# Patient Record
Sex: Female | Born: 1980
Health system: Southern US, Community
[De-identification: ages and names within clinical notes are randomized; demographics above are authoritative.]

## PROBLEM LIST (undated history)

## (undated) ENCOUNTER — Inpatient Hospital Stay (HOSPITAL_COMMUNITY): Payer: Self-pay

## (undated) DIAGNOSIS — R87619 Unspecified abnormal cytological findings in specimens from cervix uteri: Secondary | ICD-10-CM

## (undated) DIAGNOSIS — Z87440 Personal history of urinary (tract) infections: Secondary | ICD-10-CM

## (undated) DIAGNOSIS — R600 Localized edema: Secondary | ICD-10-CM

## (undated) DIAGNOSIS — IMO0002 Reserved for concepts with insufficient information to code with codable children: Secondary | ICD-10-CM

## (undated) DIAGNOSIS — K649 Unspecified hemorrhoids: Secondary | ICD-10-CM

## (undated) DIAGNOSIS — Z8619 Personal history of other infectious and parasitic diseases: Secondary | ICD-10-CM

## (undated) DIAGNOSIS — E669 Obesity, unspecified: Secondary | ICD-10-CM

## (undated) HISTORY — DX: Reserved for concepts with insufficient information to code with codable children: IMO0002

## (undated) HISTORY — DX: Obesity, unspecified: E66.9

## (undated) HISTORY — DX: Personal history of urinary (tract) infections: Z87.440

## (undated) HISTORY — DX: Personal history of other infectious and parasitic diseases: Z86.19

## (undated) HISTORY — DX: Unspecified hemorrhoids: K64.9

## (undated) HISTORY — DX: Localized edema: R60.0

## (undated) HISTORY — DX: Unspecified abnormal cytological findings in specimens from cervix uteri: R87.619

---
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
## 2002-04-21 ENCOUNTER — Emergency Department (HOSPITAL_COMMUNITY): Admission: EM | Admit: 2002-04-21 | Discharge: 2002-04-21 | Payer: Self-pay | Admitting: Emergency Medicine

## 2002-04-21 ENCOUNTER — Encounter: Payer: Self-pay | Admitting: Emergency Medicine

## 2002-09-05 ENCOUNTER — Emergency Department (HOSPITAL_COMMUNITY): Admission: EM | Admit: 2002-09-05 | Discharge: 2002-09-05 | Payer: Self-pay | Admitting: *Deleted

## 2002-11-21 ENCOUNTER — Emergency Department (HOSPITAL_COMMUNITY): Admission: EM | Admit: 2002-11-21 | Discharge: 2002-11-21 | Payer: Self-pay | Admitting: Emergency Medicine

## 2003-10-12 ENCOUNTER — Inpatient Hospital Stay (HOSPITAL_COMMUNITY): Admission: AD | Admit: 2003-10-12 | Discharge: 2003-10-12 | Payer: Self-pay | Admitting: Obstetrics and Gynecology

## 2003-11-04 ENCOUNTER — Inpatient Hospital Stay (HOSPITAL_COMMUNITY): Admission: AD | Admit: 2003-11-04 | Discharge: 2003-11-04 | Payer: Self-pay | Admitting: Obstetrics and Gynecology

## 2003-11-10 ENCOUNTER — Inpatient Hospital Stay (HOSPITAL_COMMUNITY): Admission: AD | Admit: 2003-11-10 | Discharge: 2003-11-10 | Payer: Self-pay | Admitting: Obstetrics and Gynecology

## 2003-11-13 ENCOUNTER — Inpatient Hospital Stay (HOSPITAL_COMMUNITY): Admission: AD | Admit: 2003-11-13 | Discharge: 2003-11-13 | Payer: Self-pay | Admitting: Obstetrics and Gynecology

## 2003-11-19 ENCOUNTER — Inpatient Hospital Stay (HOSPITAL_COMMUNITY): Admission: AD | Admit: 2003-11-19 | Discharge: 2003-11-19 | Payer: Self-pay | Admitting: Obstetrics and Gynecology

## 2003-12-10 ENCOUNTER — Inpatient Hospital Stay (HOSPITAL_COMMUNITY): Admission: AD | Admit: 2003-12-10 | Discharge: 2003-12-10 | Payer: Self-pay | Admitting: Obstetrics and Gynecology

## 2003-12-13 ENCOUNTER — Inpatient Hospital Stay (HOSPITAL_COMMUNITY): Admission: AD | Admit: 2003-12-13 | Discharge: 2003-12-13 | Payer: Self-pay | Admitting: Obstetrics and Gynecology

## 2003-12-18 ENCOUNTER — Inpatient Hospital Stay (HOSPITAL_COMMUNITY): Admission: AD | Admit: 2003-12-18 | Discharge: 2003-12-18 | Payer: Self-pay | Admitting: Obstetrics and Gynecology

## 2003-12-21 ENCOUNTER — Inpatient Hospital Stay (HOSPITAL_COMMUNITY): Admission: AD | Admit: 2003-12-21 | Discharge: 2003-12-21 | Payer: Self-pay | Admitting: Obstetrics and Gynecology

## 2003-12-23 ENCOUNTER — Inpatient Hospital Stay (HOSPITAL_COMMUNITY): Admission: AD | Admit: 2003-12-23 | Discharge: 2003-12-25 | Payer: Self-pay | Admitting: Obstetrics and Gynecology

## 2005-05-30 IMAGING — US US FETAL BPP W/O NONSTRESS
1 series · 13 of 27 positions shown · non-contrast
Comparison: none

CLINICAL DATA: History of preterm labor on Terbutiline with bleeding.  Assess cervix, placenta and amniotic fluid.

[Series 1: unknown · 0.30mm/px · 13 of 27 slices shown]
[im 2/27]
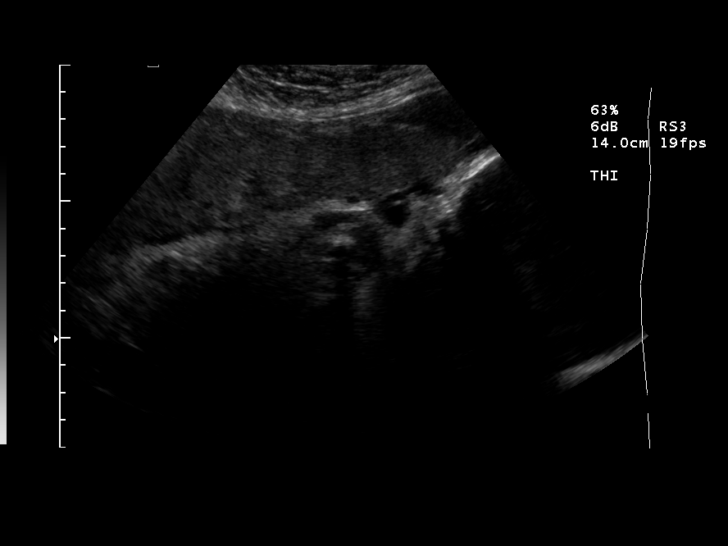
[im 4/27]
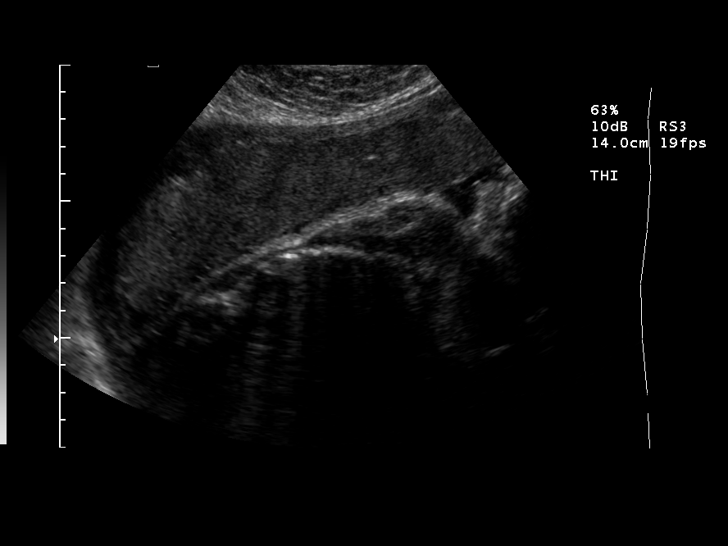
[im 6/27]
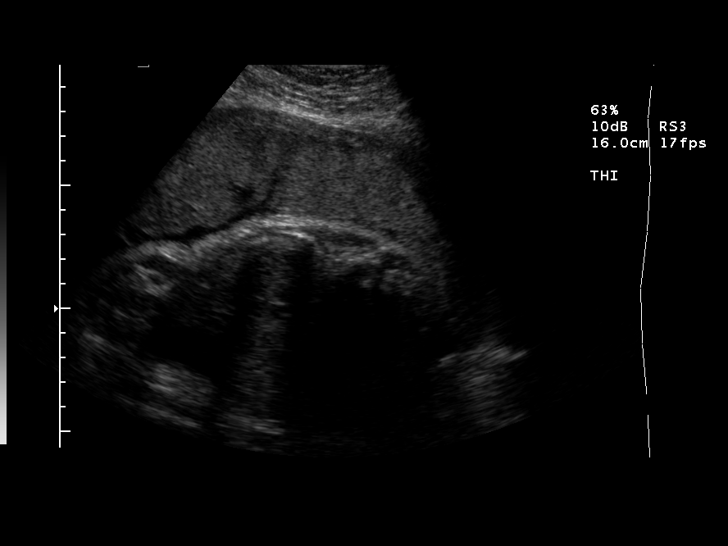
[im 8/27]
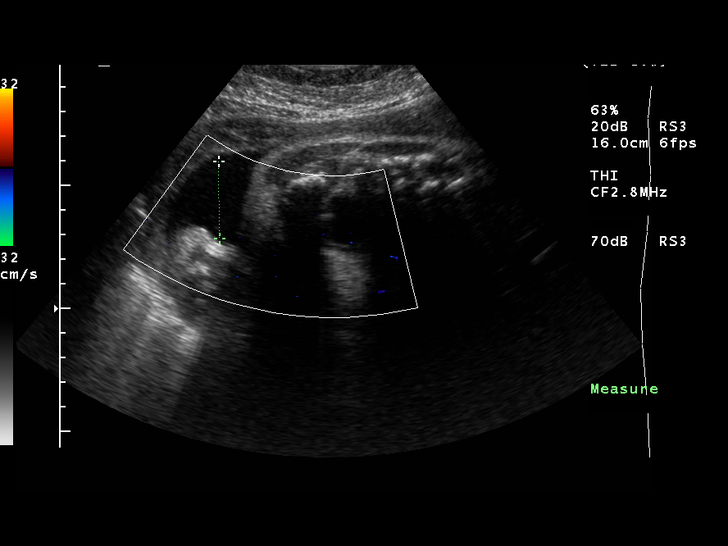
[im 10/27]
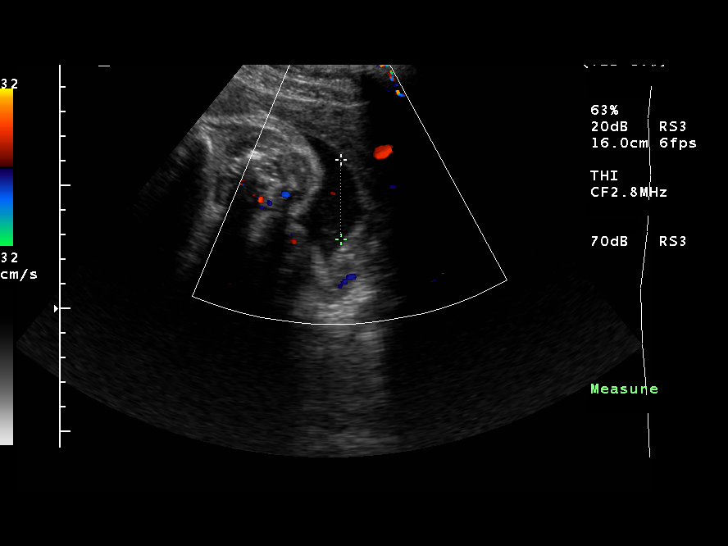
[im 12/27]
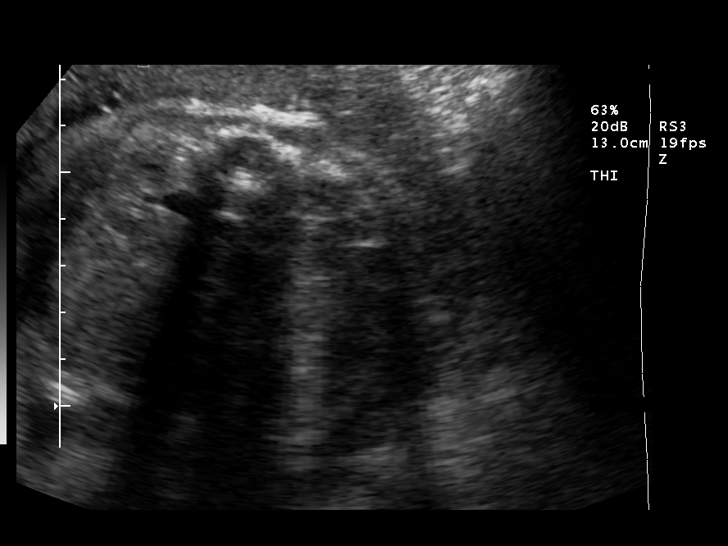
[im 14/27]
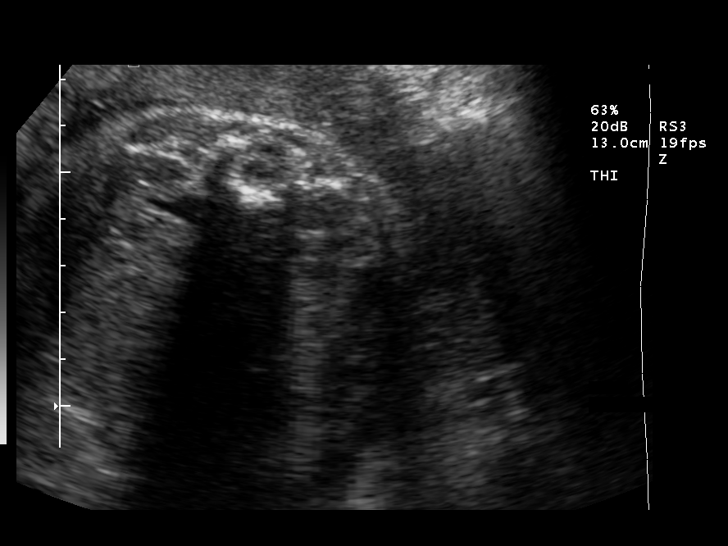
[im 16/27]
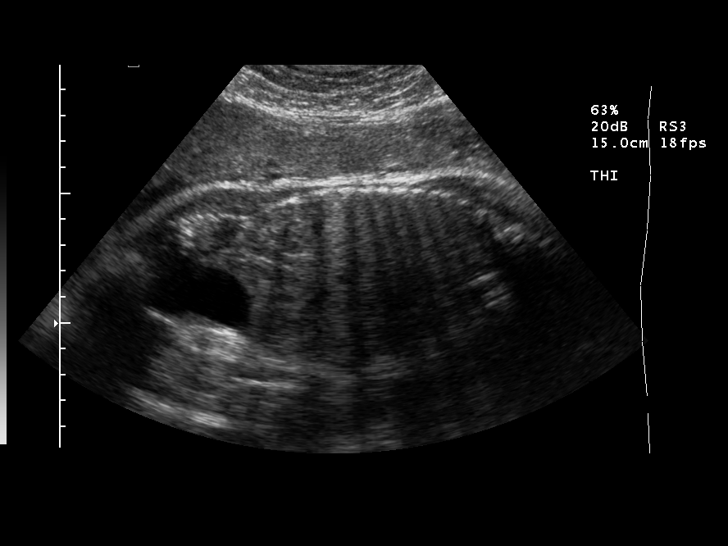
[im 18/27]
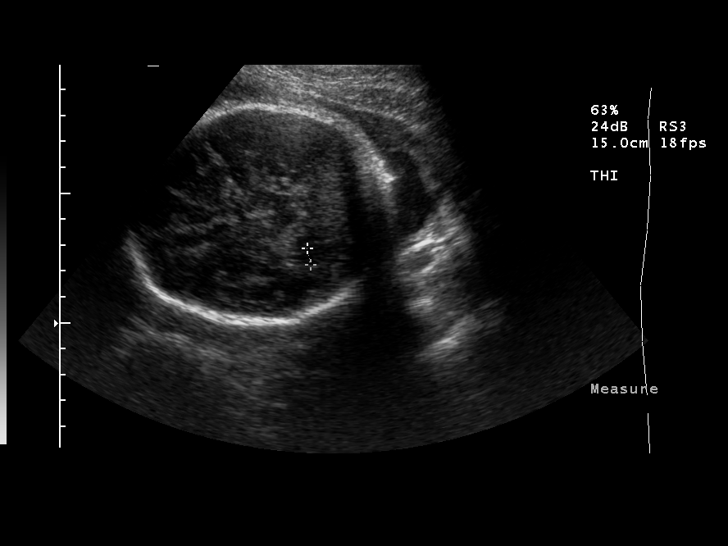
[im 20/27]
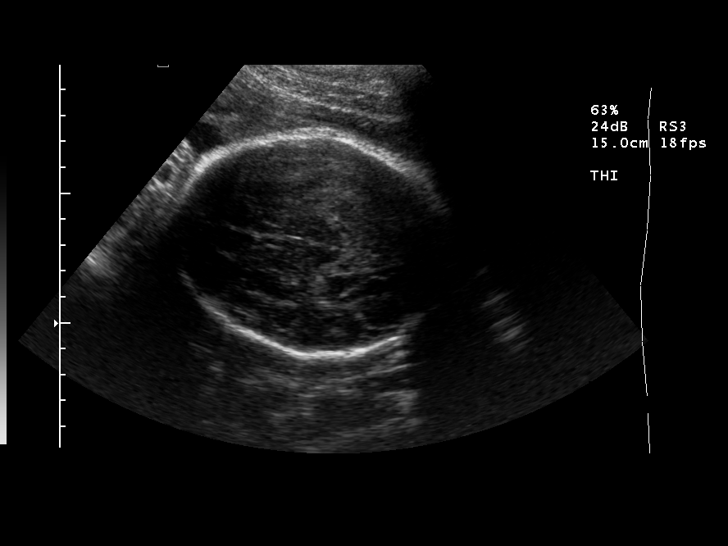
[im 22/27]
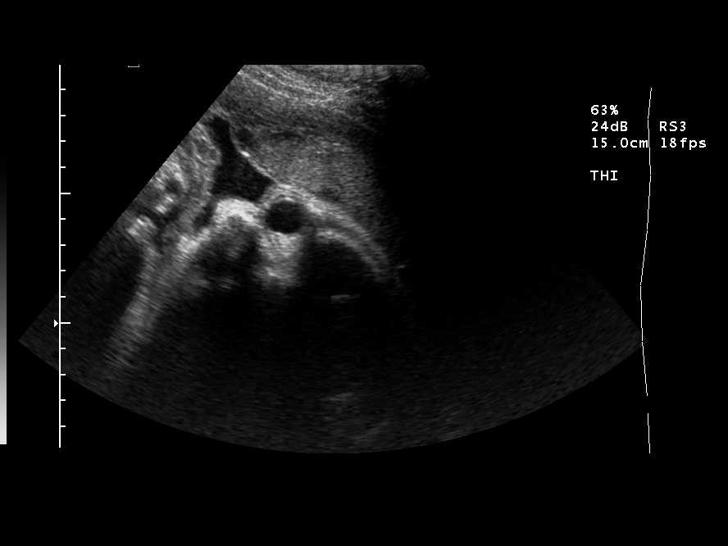
[im 24/27]
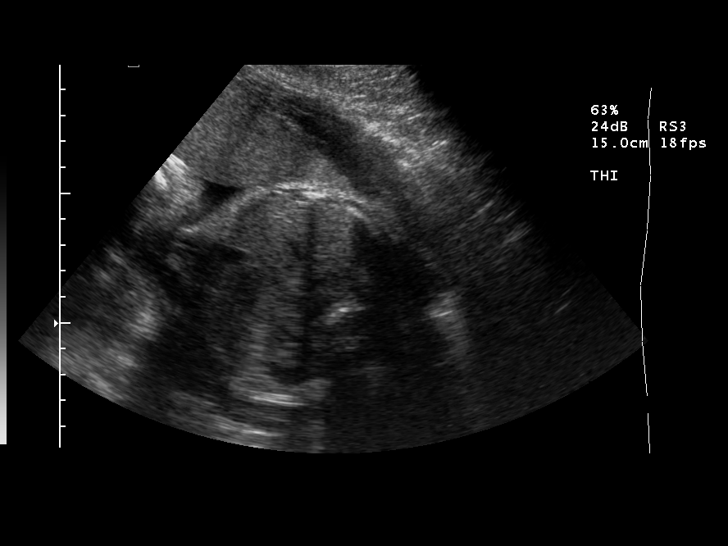
[im 26/27]
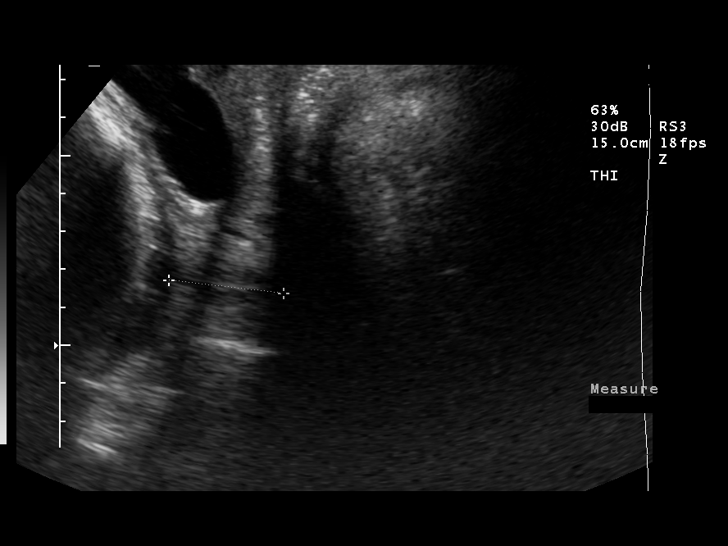

[13 of 27 positions shown; findings below may reference images not displayed]

LIMITED OBSTETRICAL ULTRASOUND
Number of Fetuses:  1
Heart Rate:  133
Movement:  Yes
Breathing:  Yes
Presentation:  Cephalic
Placental Location:  Anterior
Grade:  II
Previa:  No
Comment:  No evidence for retroplacental, preplacental or marginal hemorrhage is noted
Amniotic Fluid (Subjective):  Low normal
Amniotic Fluid (Objective):  8.7 cm AFI (5th -95th%ile =   8.3 ? 23.5 cm for 33 wks)

Fetal measurements and complete anatomic evaluation were not requested.  The following fetal anatomy was visualized during this exam:  Lateral ventricles, thalami, posterior fossa, stomach, cord insertion site, kidneys, bladder, upper lip and orbits.

MATERNAL FINDINGS
Cervix:  3.0 cm Translabially

BIOPHYSICAL PROFILE

Movement:  2    Time:  20 minutes
Breathing:  2
Tone:  2
Amniotic Fluid:  2

Total Score:  8
IMPRESSION: Subjectively and quantitatively low normal amniotic fluid volume.  
Normal cervical length.
Biophysical profile score 8 of 8.
No late developing fetal anatomic abnormalities are identified associated with the kidneys, bladder, stomach or lateral ventricles.  A four chamber heart view could not be reassessed due to positioning.  
No placental abnormality identified.

## 2005-06-23 IMAGING — US US FETAL BPP W/O NONSTRESS
1 series · 13 of 24 positions shown · non-contrast
Comparison: none

CLINICAL DATA: Spotting and contractions.

[Series 1: unknown · 0.30mm/px · 13 of 24 slices shown]
[im 1/24]
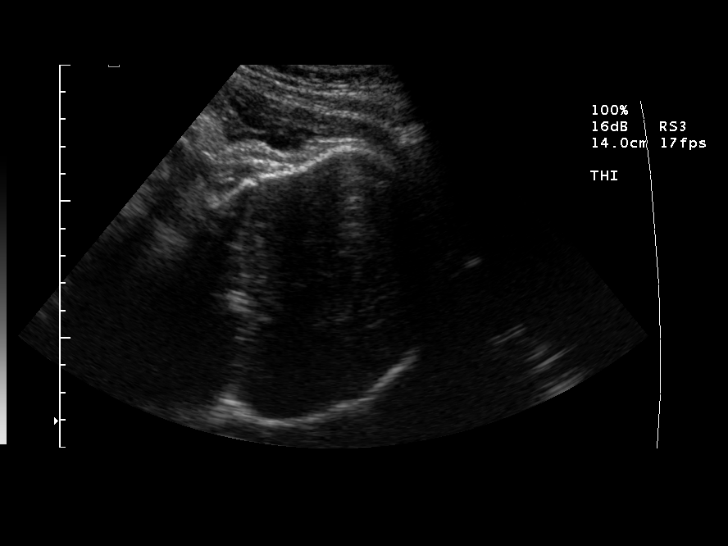
[im 3/24]
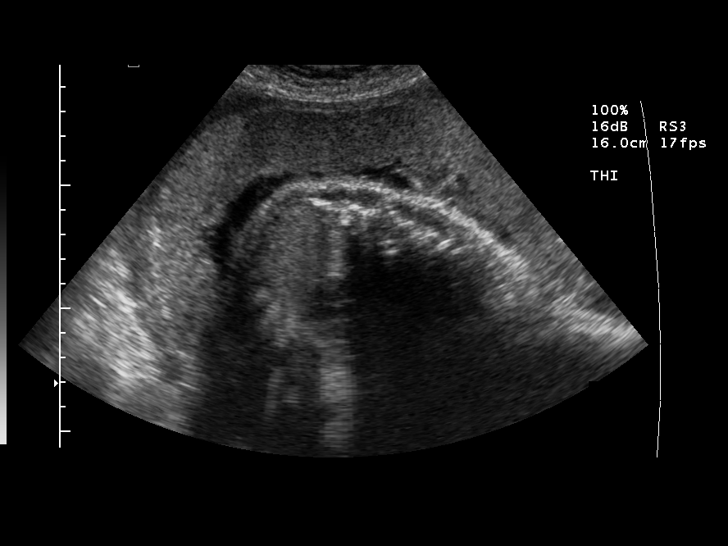
[im 5/24]
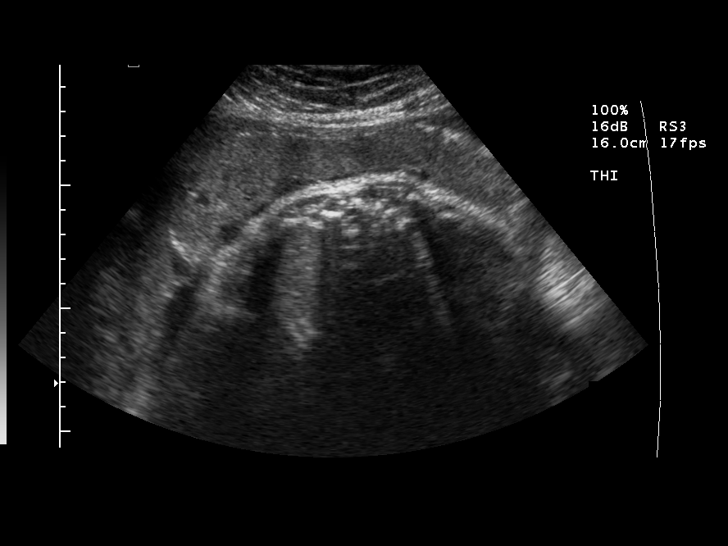
[im 7/24]
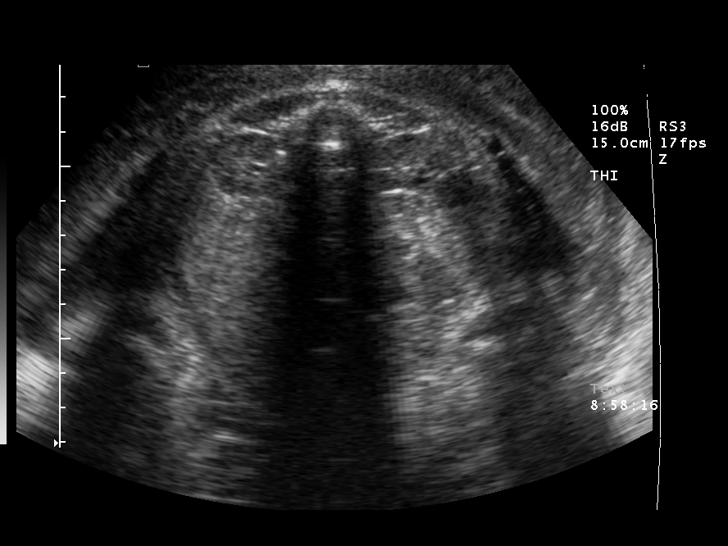
[im 9/24]
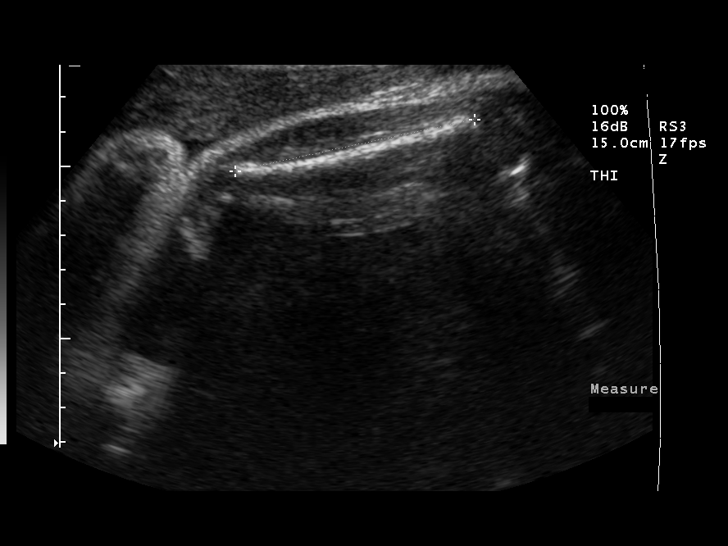
[im 11/24]
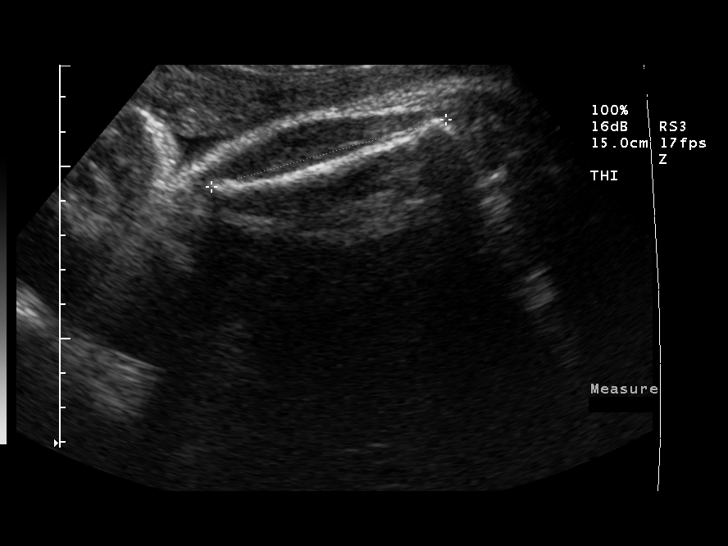
[im 13/24]
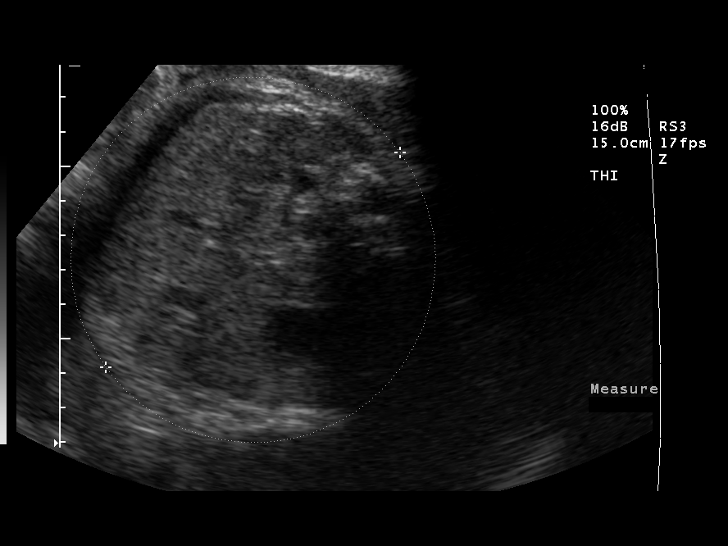
[im 14/24]
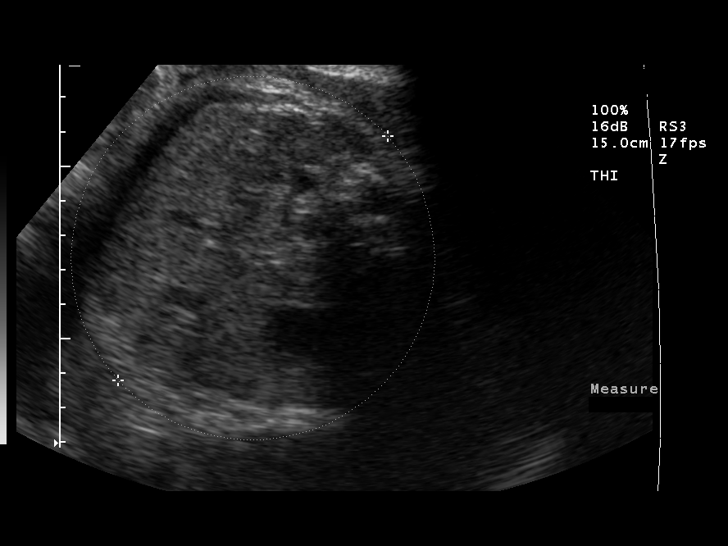
[im 16/24]
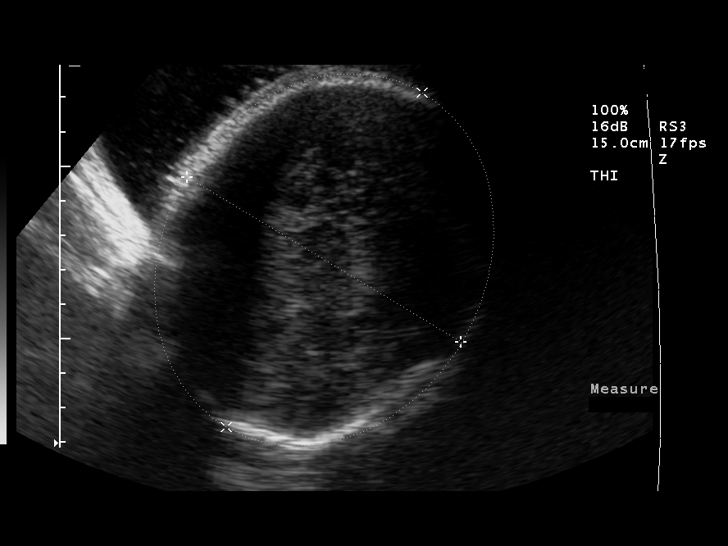
[im 18/24]
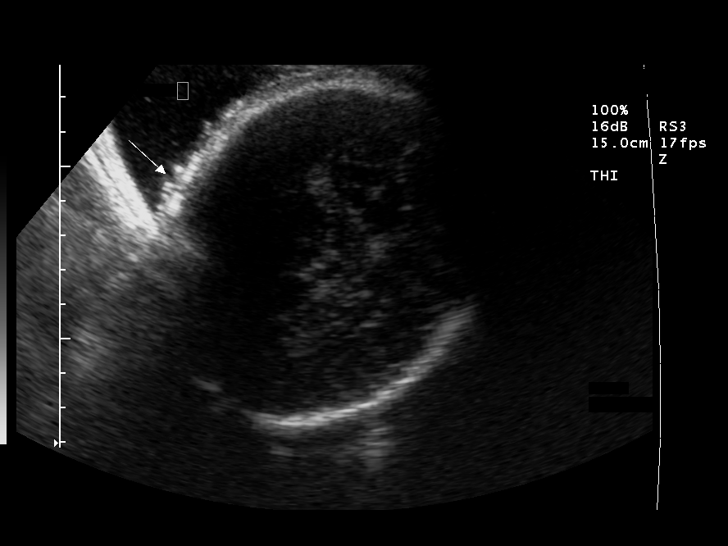
[im 20/24]
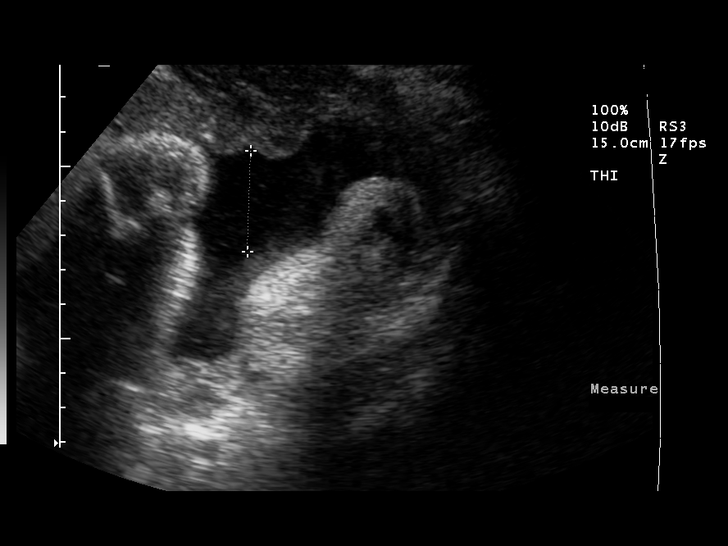
[im 22/24]
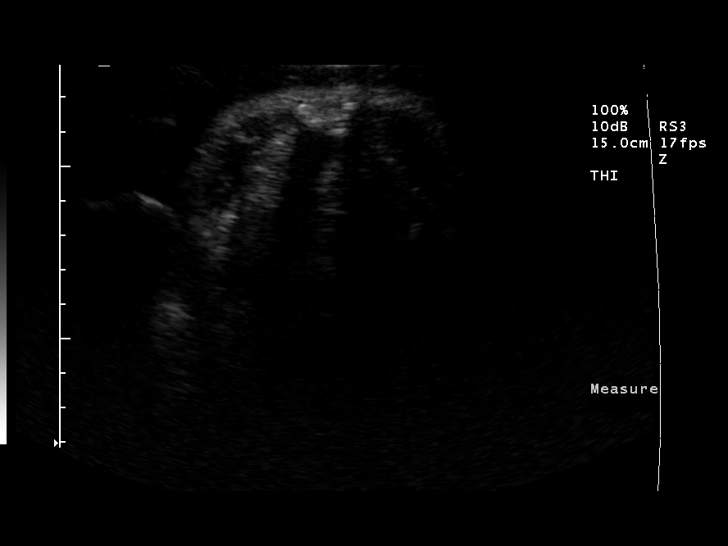
[im 24/24]
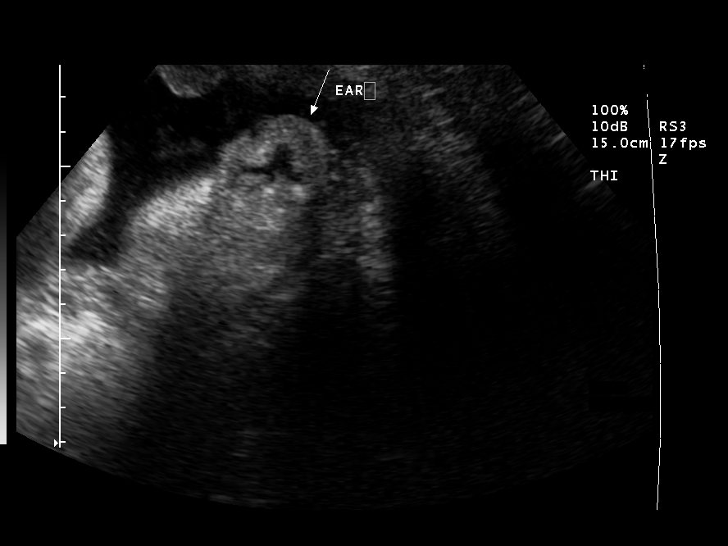

[13 of 24 positions shown; findings below may reference images not displayed]

OBSTETRICAL ULTRASOUND RE-EVALUATION
 Number of Fetuses: 1
 Heart Rate:  135
 Movement:  Yes
 Breathing:  Yes
 Presentation:  Cephalic
 Placental Location:  Anterior
 Grade:  II
 Previa:  No
 Amniotic Fluid (subjective):  Normal
 Amniotic Fluid (objective):  11.2 cm AFI (5th -95th%ile =   7.5 – 24.4 cm for 37 wks)

 FETAL BIOMETRY
 BPD:  8.9 cm   36 w 1 d
 HC:  31.7 cm   35 w 5 d
 AC:  33.1 cm   37 w 0 d
 FL:   7.1 cm   36 w 1 d

 Mean GA:  36 w 2 d
 Assigned GA:  36 w 4 d

 EFW:  5015 g (H) 50th – 75th%ile ( 8119 – 5566 g) For 37 wks

 FETAL ANATOMY
 Lateral Ventricles:  Previously seen 
 Thalami/CSP:  Previously seen 
 Posterior Fossa:  Previously seen 
 Nuchal Region:  N/A
 Spine:  Previously seen 
 4 Chamber Heart on Left:  Previously seen 
 Stomach on Left:  Visualized 
 3 Vessel Cord:  Previously seen 
 Cord Insertion Site:  Previously seen 
 Kidneys:  Visualized 
 Bladder:  Visualized 
 Extremities:  Previously seen 

 Evaluation limited by:  Fetal position and advanced gestational age 

 MATERNAL FINDINGS
 Cervix:  Not evaluated
 BIOPHYSICAL PROFILE

 Movement:   2    Time:  20 minutes
 Breathing:  2
 Tone:  2
 Amniotic Fluid:  2

 Total Score:  8
IMPRESSION: Single living intrauterine fetus in cephalic presentation with subjectively and quantitatively normal amniotic fluid volume.  Interval growth has been appropriate.  
 No focal placental abnormality is identified on today’s study.
 Biophysical profile score is [DATE] after 20 minutes.

## 2005-09-28 ENCOUNTER — Emergency Department (HOSPITAL_COMMUNITY): Admission: EM | Admit: 2005-09-28 | Discharge: 2005-09-28 | Payer: Self-pay | Admitting: Emergency Medicine

## 2009-04-29 ENCOUNTER — Ambulatory Visit: Payer: Self-pay | Admitting: Obstetrics & Gynecology

## 2010-05-10 ENCOUNTER — Ambulatory Visit: Payer: Self-pay | Admitting: Family Medicine

## 2010-05-10 ENCOUNTER — Encounter: Payer: Self-pay | Admitting: Family Medicine

## 2010-05-10 LAB — CONVERTED CEMR LAB
ALT: 15 units/L (ref 0–35)
AST: 14 units/L (ref 0–37)
CO2: 21 meq/L (ref 19–32)
Calcium: 9.5 mg/dL (ref 8.4–10.5)
Creatinine, Ser: 0.88 mg/dL (ref 0.40–1.20)
Glucose, Bld: 80 mg/dL (ref 70–99)
Hemoglobin: 13.4 g/dL (ref 12.0–15.0)
MCV: 90.4 fL (ref 78.0–100.0)
Potassium: 3.9 meq/L (ref 3.5–5.3)
RBC: 4.37 M/uL (ref 3.87–5.11)
Sodium: 139 meq/L (ref 135–145)
Total Bilirubin: 0.4 mg/dL (ref 0.3–1.2)
Total Protein: 7.7 g/dL (ref 6.0–8.3)

## 2010-05-11 ENCOUNTER — Encounter: Payer: Self-pay | Admitting: Family Medicine

## 2010-07-01 ENCOUNTER — Ambulatory Visit: Payer: Self-pay | Admitting: Family Medicine

## 2010-07-01 ENCOUNTER — Encounter: Payer: Self-pay | Admitting: Family Medicine

## 2010-07-01 LAB — CONVERTED CEMR LAB
Beta hcg, urine, semiquantitative: NEGATIVE
Bilirubin Urine: NEGATIVE
GC Probe Amp, Genital: NEGATIVE
Ketones, urine, test strip: NEGATIVE
Nitrite: NEGATIVE
Protein, U semiquant: NEGATIVE
Urobilinogen, UA: 0.2
Whiff Test: NEGATIVE

## 2010-07-03 ENCOUNTER — Encounter: Payer: Self-pay | Admitting: Family Medicine

## 2010-07-11 ENCOUNTER — Ambulatory Visit: Payer: Self-pay | Admitting: Family Medicine

## 2010-07-11 ENCOUNTER — Telehealth: Payer: Self-pay | Admitting: Family Medicine

## 2010-08-15 ENCOUNTER — Telehealth: Payer: Self-pay | Admitting: Family Medicine

## 2010-08-16 ENCOUNTER — Ambulatory Visit: Admit: 2010-08-16 | Payer: Self-pay

## 2010-08-23 NOTE — Letter (Signed)
Summary: Generic Letter  [facility]  [address]   [place], [place] [address]   Phone: [phone]  Fax: [phone]    05/11/2010  Elizebath CUTHBERT [address] [place], [place]  [address]  Dear Ms. Demartin,    It was nice to meet you.  I wanted to let you know that all of your lab work came back normal.  Please continue to take the biotin and see if your hair loss continues.  Please let me know if you think it is getting worse so that we can consider other options.       Sincerely,   Ellery Plunk MD

## 2010-08-23 NOTE — Assessment & Plan Note (Signed)
Summary: np,df   Vital Signs:  Patient profile:   30 year old female Height:      67.5 inches Weight:      205 pounds BMI:     31.75 Temp:     98.4 degrees F oral Pulse rate:   59 / minute BP sitting:   124 / 71  (right arm) Cuff size:   regular  Vitals Entered By: Tessie Fass CMA (May 10, 2010 1:33 PM) CC: NEW PT Is Patient Diabetic? No Pain Assessment Patient in pain? no        CC:  NEW PT.  History of Present Illness: new pt.    mirena- having irregular bleeding which she did not have with her last mirena.  She has also noted hair loss, loss of energy, and weight gain, though the weight has come on gradually since her pregnancy.      Habits & Providers  Alcohol-Tobacco-Diet     Alcohol drinks/day: 0     Alcohol Counseling: not indicated; patient does not drink     Tobacco Status: never     Diet Comments: healthy and varied  Exercise-Depression-Behavior     Does Patient Exercise: yes     Exercise Counseling: to improve exercise regimen     Type of exercise: walking     Exercise (avg: min/session): 30-60     Times/week: 4     STD Risk: never     Drug Use: never     Seat Belt Use: always     Sun Exposure: rarely  Current Medications (verified): 1)  Mirena 20 Mcg/24hr Iud (Levonorgestrel) 2)  Biotin 10 Mg Tabs (Biotin)  Allergies (verified): No Known Drug Allergies  Past History:  Past Medical History: G1P1 NSVD 2005 (Justin)  mirena ( august 2010)  Past Surgical History: none  Family History: sister with type 1 DM Grandmother died of MI at age 67  Social History: works at [employer] husband Lydia Guiles Smoking Status:  never Does Patient Exercise:  yes STD Risk:  never Drug Use:  never Seat Belt Use:  always Sun Exposure-Excessive:  rarely  Review of Systems  The patient denies anorexia, fever, weight loss, chest pain, and syncope.    Physical Exam  General:  Well-developed,well-nourished,in no acute distress;  alert,appropriate and cooperative throughout examination Head:  Normocephalic and atraumatic without obvious abnormalities. hair breakage worse at crown and temples but not balding. Eyes:  vision grossly intact, pupils equal, pupils round, pupils reactive to light, no injection, and no iris abnormalities.   Ears:  R ear normal and L ear normal.   Nose:  no external deformity.   Mouth:  good dentition, no gingival abnormalities, and pharynx pink and moist.   Neck:  supple, full ROM, no masses, no thyromegaly, and no thyroid nodules or tenderness.   Chest Wall:  no deformities and no tenderness.   Lungs:  normal respiratory effort, normal breath sounds, and no crackles.   Heart:  normal rate, regular rhythm, and no murmur.   Abdomen:  Bowel sounds positive,abdomen soft and non-tender without masses, organomegaly or hernias noted. Genitalia:  Pelvic Exam:        External: normal female genitalia without lesions or masses        Vagina: normal without lesions or masses        Cervix: normal without lesions or masses        Adnexa: normal bimanual exam without masses or fullness  Uterus: normal by palpation        Pap smear: performed IUD strings visualized Msk:  No deformity or scoliosis noted of thoracic or lumbar spine.  normal ROM.   Extremities:  No clubbing, cyanosis, edema, or deformity noted with normal full range of motion of all joints.   Neurologic:  No cranial nerve deficits noted. Station and gait are normal. Plantar reflexes are down-going bilaterally. DTRs are symmetrical throughout. Sensory, motor and coordinative functions appear intact. Skin:  Intact without suspicious lesions or rashes Cervical Nodes:  No lymphadenopathy noted Psych:  Cognition and judgment appear intact. Alert and cooperative with normal attention span and concentration. No apparent delusions, illusions, hallucinations   Impression & Recommendations:  Problem # 1:  HEALTH MAINTENANCE EXAM  (ICD-V70.0) Assessment New discussed wellness and weight management.  referred to Dr. Gerilyn Pilgrim for some help with nutrition.  Pt has had an ASCUS pap and has had a colposcopy which was normal.  Last pap 2010 at [facility].  will request records.     Problem # 2:  HAIR LOSS (ICD-704.00) Assessment: New ? alopecia vs brittle hair.  check TSH, CMET, CBC.  no family hx of thyroid dysfunction but sister has DM I.   Orders: TSH-[facility] [id number]) Comp Met-[facility] [id number]) CBC-[facility] (29562)  Complete Medication List: 1)  Mirena 20 Mcg/24hr Iud (Levonorgestrel) 2)  Biotin 10 Mg Tabs (Biotin)  Other Orders: Pap Smear-[facility] ([id number])  Patient Instructions: 1)  It was nice to meet you today. 2)  please make an appt with Dr. Gerilyn Pilgrim our nutritionist on the way out. 3)  I will send you a letter with the pap and the thyroid results.   Orders Added: 1)  Pap Smear-[facility] [[id number]] 2)  TSH-[facility] [[id number]] 3)  Comp Met-[facility] [[id number]] 4)  CBC-[facility] [[id number]] 5)  [facility]- New 18-89yrs [[id number]]

## 2010-08-23 NOTE — Miscellaneous (Signed)
Summary: ROI  ROI   Imported By: De Nurse 05/19/2010 15:34:17  _____________________________________________________________________  External Attachment:    Type:   Image     Comment:   External Document

## 2010-08-25 NOTE — Assessment & Plan Note (Signed)
Summary: iud removal per [facility]   Vital Signs:  Patient profile:   30 year old female Height:      67.5 inches Weight:      202.3 pounds BMI:     31.33 Temp:     97.9 degrees F oral Pulse rate:   60 / minute BP sitting:   134 / 77  (left arm) Cuff size:   regular  Vitals Entered By: Tiffany Grams LPN (July 11, 2010 2:56 PM) CC: IUD removal due to cramping and bleeding Is Patient Diabetic? No Pain Assessment Patient in pain? no        Primary Care Provider:  Ellery Plunk MD  CC:  IUD removal due to cramping and bleeding.  History of Present Illness: Cramping and bleeding x 2 months.  Had previuos IUD in 5 years and had it replaced in 04/2009.  Desires IUD removed.  Has tried pills, and ortho Evra---had skin reaction.   Desires pill.  Discussed that this may not be related to Mirena and u/s is in order, but pt. is not interested in this.  Habits & Providers  Alcohol-Tobacco-Diet     Alcohol drinks/day: 0     Alcohol Counseling: not indicated; patient does not drink     Tobacco Status: never     Diet Comments: healthy and varied  Current Problems (verified): 1)  Abdominal Cramps  (ICD-789.00) 2)  Health Maintenance Exam  (ICD-V70.0) 3)  Hair Loss  (ICD-704.00) 4)  Screening For Malignant Neoplasm of The Cervix  (ICD-V76.2)  Current Medications (verified): 1)  Mirena 20 Mcg/24hr Iud (Levonorgestrel) 2)  Biotin 10 Mg Tabs (Biotin) 3)  Tramadol Hcl 50 Mg Tabs (Tramadol Hcl) .... Take 1-2 Tabs Q6h As Needed Pain. 4)  Promethazine Hcl 12.5 Mg Tabs (Promethazine Hcl) .... Take 1-2 Q6 H As Needed Nausea 5)  Zofran 8 Mg Tabs (Ondansetron Hcl) .... Take One Q 6 Hours As Needed Nausea  Allergies (verified): No Known Drug Allergies  Past History:  Past Medical History: Last updated: 05/10/2010 G1P1 NSVD 2005 [name])  mirena ( august 2010)  Past Surgical History: Last updated: 05/10/2010 none  Family History: Last updated: 05/10/2010 sister with type 1  DM Grandmother died of MI at age 58  Social History: Last updated: 05/10/2010 works at [employer] husband [name] Son [name]  Risk Factors: Alcohol Use: 0 (07/11/2010) Diet: healthy and varied (07/11/2010) Exercise: yes (05/10/2010)  Risk Factors: Smoking Status: never (07/11/2010)  Review of Systems       + nausea, vaginal bleeding  Physical Exam  General:  alert, well-developed, and well-nourished.   Head:  normocephalic and atraumatic.   Neck:  supple.   Lungs:  normal respiratory effort.   Heart:  normal rate.   Abdomen:  soft and non-tender.     Impression & Recommendations:  Problem # 1:  ABDOMINAL CRAMPS (ICD-789.00)  s/p IUD removal It symtoms persist, order pelvic sonogram  Orders: IUD removal -[facility] ([id number])  Problem # 2:  SURVEILLANCE PREV PRESCRIBED CONTRACEPT PILL (ICD-V25.41) Risks discussed with pt.  Advised need to take daily.  No family h/o blood clots.  Complete Medication List: 1)  Mirena 20 Mcg/24hr Iud (Levonorgestrel) 2)  Biotin 10 Mg Tabs (Biotin) 3)  Tramadol Hcl 50 Mg Tabs (Tramadol hcl) .... Take 1-2 tabs q6h as needed pain. 4)  Promethazine Hcl 12.5 Mg Tabs (Promethazine hcl) .... Take 1-2 q6 h as needed nausea 5)  Zofran 8 Mg Tabs (Ondansetron hcl) .... Take one q 6 hours  as needed nausea 6)  Sprintec 28 0.25-35 Mg-mcg Tabs (Norgestimate-eth estradiol) .Marland Kitchen.. 1 by mouth daily  Patient Instructions: 1)  Please schedule a follow-up appointment as needed .  Prescriptions: SPRINTEC 28 0.25-35 MG-MCG TABS (NORGESTIMATE-ETH ESTRADIOL) 1 by mouth daily  #1 pack x 3   Entered and Authorized by:   Tinnie Gens MD   Signed by:   Tinnie Gens MD on 07/11/2010   Method used:   Electronically to        [facility]* (retail)       [address]       [address] Shipping/mailing       [place], [place]  [address]       Ph: [phone]       Fax: [phone]   RxID:   [id number]    Orders Added: 1)  IUD removal -[facility] [[id number]]

## 2010-08-25 NOTE — Assessment & Plan Note (Signed)
Summary: IUD pain,df   Vital Signs:  Patient profile:   30 year old female Height:      67.5 inches Weight:      202.4 pounds BMI:     31.35 Temp:     98.4 degrees F oral Pulse rate:   49 / minute BP sitting:   128 / 41  (left arm) Cuff size:   regular  Vitals Entered By: Garen Grams LPN (July 01, 2010 9:43 AM) CC: pelvic cramd with some nausea Is Patient Diabetic? No Pain Assessment Patient in pain? yes     Location: lower abd Type: cramping   Primary Care Yaremi Stahlman:  Ellery Plunk MD  CC:  pelvic cramd with some nausea.  History of Present Illness: cramping intermittently x 2 wks.  no relieved by ibuprofen.  had to go home from work.  denies bleeding, does not usually have periods on the mirena.  mirena in for 5 years previously with no problem.  this one for 1 year.  no diarrhea, fevers.  one episode of vomitting yesterday.  today does not feel nauseous. pt denies d/c, itching, odor.  pt is married and monogamous.  Habits & Providers  Alcohol-Tobacco-Diet     Alcohol drinks/day: 0     Alcohol Counseling: not indicated; patient does not drink     Tobacco Status: never     Diet Comments: healthy and varied  Current Medications (verified): 1)  Mirena 20 Mcg/24hr Iud (Levonorgestrel) 2)  Biotin 10 Mg Tabs (Biotin) 3)  Tramadol Hcl 50 Mg Tabs (Tramadol Hcl) .... Take 1-2 Tabs Q6h As Needed Pain. 4)  Promethazine Hcl 12.5 Mg Tabs (Promethazine Hcl) .... Take 1-2 Q6 H As Needed Nausea 5)  Zofran 8 Mg Tabs (Ondansetron Hcl) .... Take One Q 6 Hours As Needed Nausea  Allergies (verified): No Known Drug Allergies  Review of Systems       The patient complains of abdominal pain.  The patient denies anorexia, fever, and weight loss.    Physical Exam  General:  NADwell-developed, well-nourished, and well-hydrated.   Abdomen:  soft, non-tender, normal bowel sounds, no distention, and no guarding.   Genitalia:  Pelvic Exam:        External: normal female genitalia  without lesions or masses        Vagina: normal without lesions or masses        Cervix: normal without lesions or masses        Adnexa: normal bimanual exam without masses or fullness        Uterus: normal by palpation        Pap smear: not performed strings easily visualized   Impression & Recommendations:  Problem # 1:  ABDOMINAL CRAMPS (ICD-789.00) Assessment New reviewed results of u preg, u/a, wet prep with pt before she left.  u preg neg so ectopic ruled out.  can visualize strings so mirena has not migrated.  u/a and description not consistent with kidney stone.  ? if this could be related to an ovarian cyst.  pt given red flags, to rtc for follow up as needed.  consider pulling mirena if pt desires though this is unlikely to be source. Orders: Urinalysis-[facility] (00000) U Preg-[facility] (81025) GC/Chlamydia-[facility] (87591/87491) Wet Prep- [facility] ([address]) [facility]- Est  Level 4 ([address])  Complete Medication List: 1)  Mirena 20 Mcg/24hr Iud (Levonorgestrel) 2)  Biotin 10 Mg Tabs (Biotin) 3)  Tramadol Hcl 50 Mg Tabs (Tramadol hcl) .... Take 1-2 tabs q6h as needed pain.  4)  Promethazine Hcl 12.5 Mg Tabs (Promethazine hcl) .... Take 1-2 q6 h as needed nausea 5)  Zofran 8 Mg Tabs (Ondansetron hcl) .... Take one q 6 hours as needed nausea  Patient Instructions: 1)  Today we checked your urine and some vaginal swabs 2)  Your urine was concentrated- maybe you are a little dehydrated with your vomitting.  Drink plenty of water and try the zofran or phenergan if you get sick again.  Let me know if you have a lot of stomach burning. 3)  For the cramps- 1-2 tramadol and 650 of tylenol q 6.  If in one week, not any better, call me and we will set up the U/S.  Come and see me to check in in 2-3 weeks. Prescriptions: ZOFRAN 8 MG TABS (ONDANSETRON HCL) take one q 6 hours as needed nausea  #12 x 1   Entered and Authorized by:   Ellery Plunk MD   Signed by:   Ellery Plunk MD on 07/01/2010   Method used:    Electronically to        [facility] (retail)       [address]       [place], [place]  [address]       Ph: [phone]       Fax: [phone]   RxID:   [id number] PROMETHAZINE HCL 12.5 MG TABS (PROMETHAZINE HCL) take 1-2 q6 h as needed nausea  #12 x 1   Entered and Authorized by:   Ellery Plunk MD   Signed by:   Ellery Plunk MD on 07/01/2010   Method used:   Electronically to        [facility] (retail)       [address]       [place], [place]  [address]       Ph: [phone]       Fax: [phone]   RxID:   [id number] TRAMADOL HCL 50 MG TABS (TRAMADOL HCL) take 1-2 tabs q6h as needed pain.  #30 x 0   Entered and Authorized by:   Ellery Plunk MD   Signed by:   Ellery Plunk MD on 07/01/2010   Method used:   Electronically to        [facility] (retail)       [address]       [place], [place]  [address]       Ph: [phone]       Fax: [phone]   RxID:   [id number]    Orders Added: 1)  Urinalysis-[facility] [00000] 2)  U Preg-[facility] [81025] 3)  GC/Chlamydia-[facility] [87591/87491] 4)  Wet Prep- [facility] [87210] 5)  [facility]- Est  Level 4 [07371]    Laboratory Results   Urine Tests  Date/Time Received: July 01, 2010 10:30 AM  Date/Time Reported: July 01, 2010 11:15 AM   Routine Urinalysis   Color: yellow Appearance: Clear Glucose: negative   (Normal Range: Negative) Bilirubin: negative   (Normal Range: Negative) Ketone: negative   (Normal Range: Negative) Spec. Gravity: >=1.030   (Normal Range: 1.003-1.035) Blood: trace-intact   (Normal Range: Negative) pH: 6.0   (Normal Range: 5.0-8.0) Protein: negative   (Normal Range: Negative) Urobilinogen: 0.2   (Normal Range: 0-1) Nitrite: negative   (Normal Range: Negative) Leukocyte Esterace: negative   (  Normal Range: Negative)  Urine Microscopic WBC/HPF: 1-5 RBC/HPF: 0-3 Bacteria/HPF: 1+ Mucous/HPF:  3+ Epithelial/HPF: 1-5    Urine HCG: negative Comments: ...............test performed by......Marland KitchenBonnie A. [place], MLS (ASCP)cm  Date/Time Received: July 01, 2010 10:30 AM  Date/Time Reported: July 01, 2010 11:17 AM   Wet [place] Source: vag WBC/hpf: >20 Bacteria/hpf: 3+  Rods Clue cells/hpf: none  Negative whiff Yeast/hpf: few Trichomonas/hpf: none Comments: ...............test performed by......Marland KitchenBonnie A. [place], MLS (ASCP)cm

## 2010-08-25 NOTE — Letter (Signed)
Summary: Generic Letter  [facility]  [address]   [place], [place] [address]   Phone: [phone]  Fax: [phone]    07/03/2010  Jeanifer CUDE [address] [place], [place]  [address]  Dear Ms. Schifano,     I wanted to let you know that the rest of the testing you had done was negative.  Please follow up as necessary.  I hope that you are feeling better.      Sincerely,   Ellery Plunk MD  Appended Document: Generic Letter mailed

## 2010-08-25 NOTE — Progress Notes (Signed)
Summary: Triage  Phone Note Call from Patient Call back at Home Phone [phone]   Reason for Call: Talk to Nurse Summary of Call: pt had iud removed about 1 month ago, has not had a normal period since, has a few questions about side effects.  Initial call taken by: Knox Royalty,  August 15, 2010 3:33 PM  Follow-up for Phone Call        IUD  taken out on december 19th & had a period.  not using birth control. is having sex. no periods since. took home test & it is negative. appt tomorrow to see Dr. Lelon Perla & get pregnancy test here. states "we are open to the idea of another" Follow-up by: Golden Circle RN,  August 15, 2010 3:35 PM

## 2010-08-25 NOTE — Progress Notes (Signed)
  Phone Note Call from Patient   Caller: Patient Call For: [phone] Summary of Call: Need to have call back in ref to IUD Initial call taken by: Abundio Miu,  July 11, 2010 8:58 AM  Follow-up for Phone Call        bleeding.  still with some cramping, even with tramadol.  wants her IUD out.  told her to try to make appt with me today. Follow-up by: Ellery Plunk MD,  July 11, 2010 9:40 AM  Additional Follow-up for Phone Call Additional follow up Details #1::        Pt seen by Dr. Shawnie Pons for removal Additional Follow-up by: Abundio Miu,  August 03, 2010 4:19 PM

## 2010-11-22 HISTORY — PX: DILATION AND CURETTAGE OF UTERUS: SHX78

## 2010-11-24 ENCOUNTER — Ambulatory Visit (HOSPITAL_COMMUNITY)
Admission: RE | Admit: 2010-11-24 | Discharge: 2010-11-24 | Disposition: A | Discharge status: Home / Self Care | Payer: 59 | Source: Ambulatory Visit | Attending: Obstetrics and Gynecology | Admitting: Obstetrics and Gynecology

## 2010-11-24 LAB — ABO/RH: ABO/RH(D): O POS

## 2010-11-29 ENCOUNTER — Other Ambulatory Visit: Payer: Self-pay | Admitting: Obstetrics and Gynecology

## 2010-11-29 ENCOUNTER — Ambulatory Visit (HOSPITAL_COMMUNITY)
Admission: RE | Admit: 2010-11-29 | Discharge: 2010-11-29 | Disposition: A | Discharge status: Home / Self Care | Payer: 59 | Source: Ambulatory Visit | Attending: Obstetrics and Gynecology | Admitting: Obstetrics and Gynecology

## 2010-11-29 DIAGNOSIS — O021 Missed abortion: Secondary | ICD-10-CM | POA: Insufficient documentation

## 2010-11-29 LAB — CBC
HCT: 37.8 % (ref 36.0–46.0)
Platelets: 297 10*3/uL (ref 150–400)
WBC: 6.9 10*3/uL (ref 4.0–10.5)

## 2010-12-09 NOTE — H&P (Signed)
NAMEJAQUALA, FULLER                          ACCOUNT NO.:  [id number]   MEDICAL RECORD NO.:  [id number]                   PATIENT TYPE:  INP   LOCATION:  9165                                 FACILITY:  [facility]   PHYSICIAN:  Osborn Coho, M.D.                DATE OF BIRTH:  September 04, 1980   DATE OF ADMISSION:  12/23/2003  DATE OF DISCHARGE:                                HISTORY & PHYSICAL   Ms. [name] is a 30 year old gravida 2, para 0, 0, 1, 0 at 38-1/7 weeks who  presented with spontaneous rupture of membranes at approximately 10:30 p.m.  Clear fluid noted with irregular uterine contractions.  She denies any  bleeding and reports positive fetal movement.   PREGNANCY HAS BEEN REMARKABLE FOR:  1. Abnormal last menstrual period.  2. Asthma.  3. Second trimester spotting.   PRENATAL LABS:  Blood type is O positive Rh antibody negative, VDRL  nonreactive, rubella titer positive, hepatitis B surface antigen negative,  sickle cell test was negative.  GC and Chlamydia cultures were negative at  new OB visit and also at 36 weeks.  Pap smear was normal.  Hemoglobin upon entering the practice was 13.5 (it was 11.6 at 28 weeks).  Group B strep culture was negative at 36 weeks.  Estimated date of  confinement of January 06, 2004 was established by last menstrual period and  was in agreement with ultrasound at approximately 8 and 18 weeks.   HISTORY OF PRESENT PREGNANCY:  The patient under care at approximately 12  weeks at [facility].  She had her initial work up at Dr. Elsie Stain  office.  She had some pink spotting just after that visit and was evaluated.  She had an ultrasound at 18 weeks that showed normal growth and development.  Quadruple screen was declined.  The patient had another episode of bleeding  at 18 weeks, again with no significant findings.  She was treated for a UTI  at 18 weeks.  She was treated for BV at 19 weeks and had some right round  ligament pain at that time  which was treated with Ibuprofen and Darvocet.  The patient had vomiting and pain at 21 weeks and was evaluated.  She  reported some type of nodule in her groin area at 25 weeks, this was  evaluated and noted to be a hidradenitis lesion.  She was placed on Keflex.  She also was given an inhaler for her asthma at that same time.  She was  evaluated out of town for rupture of membranes at approximately 27 weeks and  was found to have no evidence of leaking.  She continued to have some  episodes of pink spotting for which she was evaluated in maternity  admissions.  She continued to demonstrate some glycosuria.  She was  evaluated at maternity admissions between 31-6/7 and 32 weeks (she was  evaluated at maternity admissions  several times).  She also had several  episodes of glycosuria and then had a fasting blood sugar and 2-hour  postprandial.  Fasting blood sugar was 87, postprandial was 140.  No follow  up was needed.  She was on p.r.n. Terbutaline until approximately 35 to 36  weeks and has had ongoing issues with episodes of contractions since that  time.  She was seen in maternity admissions over this past weekend for  evaluation of decreased fetal movement and mild elevation of her blood  pressure, normal findings were noted.   OBSTETRICAL HISTORY:  The patient has history of 1 TAB.   PAST MEDICAL HISTORY:  She was a previous condom user.  She also had  Chlamydia 2 years ago and was treated.  She has a history of asthma but no  recent attack.  She has also had 1 urinary tract infection in the past.   FAMILY HISTORY:  Maternal grandmother had a heart attack.  Her uncles have  had strokes.  Her mother has had hypertension.  Her sister has type 2  diabetes.   ALLERGIES:  The patient is sensitive to Ortho-Ever which causes hives.   GENETIC HISTORY:  Unremarkable.   SOCIAL HISTORY:  The patient is single.  The father of the baby is involved  and supportive, his name is [name].  The patient is a Biomedical scientist at [employer].  Her partner is also a Consulting civil engineer at [employer].  She has been  followed by the Certified Nurse Midwife Service at [facility].  She  denies any alcohol, drug or tobacco use during this pregnancy.   PHYSICAL EXAMINATION:  VITAL SIGNS:  Blood pressure is 144/83, other vital  signs are stable.  HEENT:  Within normal limits.  LUNGS:  Breath sounds are clear.  HEART:  Regular rate and rhythm without murmur.  BREASTS:  Soft and nontender.  ABDOMEN:  Fundal height is approximately 38 cm.  Estimated fetal weight is 7  to 8 pounds.  Uterine contractions are every 2 to 6 minutes with a coupling  and tripling pattern.  The patient is noted to be leaking clear fluid, fern  positive.  CERVIX:  Is 2 to 3 cm, 80 to 90%, vertex at a -1 to -2 station. Fetal heart  rate is reassuring now with some accelerations noted and occasional mild  variables are noted.  EXTREMITIES:  Deep tendon reflexes are 2+ without clonus.  There is trace  edema noted.   IMPRESSION:  1. Intrauterine pregnancy at 38-1/7 weeks.  2. Early labor with spontaneous rupture of membranes.  3. Negative group B strep.   PLAN:  1. Admit to birthing suite for consult with Dr. Su Hilt as attending     physician.  2. Routine Certified Nurse Midwife orders.  3. Plan reevaluation in 1 to 2 hours for progress.  If uterine contraction     pattern remains irregular, consider augmentation.  4. Pain medicine p.r.n.     Renaldo Reel Emilee Hero, C.N.M.                   Osborn Coho, M.D.    VLL/MEDQ  D:  12/23/2003  T:  12/23/2003  Job:  [id number]

## 2010-12-15 NOTE — Consult Note (Signed)
  NAMEROSEANNE, [name]              ACCOUNT NO.:  [id number]  MEDICAL RECORD NO.:  [id number]           PATIENT TYPE:  O  LOCATION:  [facility]                          [facility]:  [facility]  PHYSICIAN:  Hal Morales, M.D.DATE OF BIRTH:  1980-09-14  DATE OF CONSULTATION:  11/29/2010 DATE OF DISCHARGE:                                CONSULTATION   PREOPERATIVE DIAGNOSIS:  Missed abortion at 6 weeks.  POSTOPERATIVE DIAGNOSIS:  Missed abortion at 6 weeks.  OPERATION:  Suction dilatation and evacuation.  SURGEON:  Hal Morales, MD  ANESTHESIA:  Monitored anesthesia care and local.  ESTIMATED BLOOD LOSS:  Less than 10 mL.  COMPLICATIONS:  None.  FINDINGS:  The patient had a moderate amount of products of conception at the time of the D and E.  PROCEDURE:  The patient was taken to the operating room after appropriate identification and placed on the operating table.  After the attainment of monitored anesthesia care, the patient was placed in lithotomy position.  The perineum and vagina were prepped with multiple layers of Betadine and a red Robinson catheter was used to empty the bladder.  The perineum was draped as a sterile field.  A Graves speculum was placed in the vagina and a paracervical block achieved with a total of 10 mL of 2% Xylocaine in the 5 and 7 o'clock positions.  A single- tooth tenaculum was used to grasp the cervix.  The cervix was dilated to accommodate a #7 suction curette.  This suction curette was used to suction evacuate all products of conception from all quadrants of the uterus.  A sharp curette was used to ensure that all products of conception have been removed.  Hemostasis was noted to be adequate.  All instruments were then removed from the vagina.  The patient was taken from the operating room to the recovery room in satisfactory condition having tolerated the procedure well with sponge and instrument counts correct.  Specimen to pathology is  products of conception.  INTRAOPERATIVE MEDICATION:  Toradol 30 mg IV 30 mg IM.  DISCHARGE INSTRUCTIONS:  Printed instructions from the [facility] for D any D.  DISCHARGE MEDICATIONS: 1. NuvaRing one ring per vagina q. 21 days as directed. 2. Doxycycline 100 mg p.o. b.i.d. for 7 days. 3. Ibuprofen 600 mg p.o. q.6 h. for 3 days and then p.r.n. pain. 4. Methergine 0.2 mg p.o. q.6 h. for 2 days.  FOLLOWUP INSTRUCTIONS:  The patient will follow up in 2 weeks with Dr. Pennie Rushing.  BLOOD TYPE:  The patient is Rh positive.     Hal Morales, M.D.     VPH/MEDQ  D:  11/29/2010  T:  11/30/2010  Job:  [id number]  Electronically Signed by Dierdre Forth M.D. on 12/15/2010 11:20:27 PM

## 2011-02-23 ENCOUNTER — Other Ambulatory Visit: Payer: Self-pay | Admitting: Obstetrics and Gynecology

## 2011-02-24 ENCOUNTER — Encounter (HOSPITAL_COMMUNITY): Payer: Self-pay | Admitting: *Deleted

## 2011-02-25 ENCOUNTER — Other Ambulatory Visit: Payer: Self-pay | Admitting: Obstetrics and Gynecology

## 2011-02-25 ENCOUNTER — Ambulatory Visit (HOSPITAL_COMMUNITY): Payer: 59 | Admitting: Anesthesiology

## 2011-02-25 ENCOUNTER — Encounter (HOSPITAL_COMMUNITY): Payer: Self-pay | Admitting: Anesthesiology

## 2011-02-25 ENCOUNTER — Ambulatory Visit (HOSPITAL_COMMUNITY)
Admission: RE | Admit: 2011-02-25 | Discharge: 2011-02-25 | Disposition: A | Discharge status: Home / Self Care | Payer: 59 | Source: Ambulatory Visit | Attending: Obstetrics and Gynecology | Admitting: Obstetrics and Gynecology

## 2011-02-25 ENCOUNTER — Encounter (HOSPITAL_COMMUNITY)
Admission: RE | Disposition: A | Discharge status: Home / Self Care | Payer: Self-pay | Source: Ambulatory Visit | Attending: Obstetrics and Gynecology

## 2011-02-25 DIAGNOSIS — O034 Incomplete spontaneous abortion without complication: Secondary | ICD-10-CM | POA: Insufficient documentation

## 2011-02-25 HISTORY — PX: DILATION AND EVACUATION: SHX1459

## 2011-02-25 LAB — CBC
HCT: 36.2 % (ref 36.0–46.0)
Hemoglobin: 12.5 g/dL (ref 12.0–15.0)
RBC: 3.94 MIL/uL (ref 3.87–5.11)
WBC: 4.7 10*3/uL (ref 4.0–10.5)

## 2011-02-25 SURGERY — DILATION AND EVACUATION, UTERUS
Anesthesia: Monitor Anesthesia Care

## 2011-02-25 MED ORDER — FENTANYL CITRATE 0.05 MG/ML IJ SOLN
INTRAMUSCULAR | Status: DC | PRN
  Administered 2011-02-25 (×2): 25 ug via INTRAVENOUS

## 2011-02-25 MED ORDER — PROPOFOL 10 MG/ML IV EMUL
INTRAVENOUS | Status: AC
  Filled 2011-02-25: qty 50

## 2011-02-25 MED ORDER — METOCLOPRAMIDE HCL 10 MG PO TABS: 10.0000 mg | ORAL_TABLET | Freq: Once | ORAL | Status: DC | PRN

## 2011-02-25 MED ORDER — GLYCOPYRROLATE 0.2 MG/ML IJ SOLN
INTRAMUSCULAR | Status: DC | PRN
  Administered 2011-02-25: 0.3 mg via INTRAVENOUS

## 2011-02-25 MED ORDER — DEXAMETHASONE SODIUM PHOSPHATE 10 MG/ML IJ SOLN
INTRAMUSCULAR | Status: DC | PRN
  Administered 2011-02-25: 10 mg via INTRAVENOUS

## 2011-02-25 MED ORDER — LACTATED RINGERS IV SOLN
INTRAVENOUS | Status: DC
  Administered 2011-02-25 (×2): via INTRAVENOUS

## 2011-02-25 MED ORDER — HYDROCODONE-ACETAMINOPHEN 5-500 MG PO TABS: 1.0000 | ORAL_TABLET | Freq: Four times a day (QID) | ORAL | Status: AC | PRN

## 2011-02-25 MED ORDER — ONDANSETRON HCL 4 MG/2ML IJ SOLN
INTRAMUSCULAR | Status: DC | PRN
  Administered 2011-02-25: 4 mg via INTRAVENOUS

## 2011-02-25 MED ORDER — LIDOCAINE HCL 1 % IJ SOLN
INTRAMUSCULAR | Status: DC | PRN
  Administered 2011-02-25: 20 mL

## 2011-02-25 MED ORDER — MIDAZOLAM HCL 5 MG/5ML IJ SOLN
INTRAMUSCULAR | Status: DC | PRN
  Administered 2011-02-25: 2 mg via INTRAVENOUS

## 2011-02-25 MED ORDER — FENTANYL CITRATE 0.05 MG/ML IJ SOLN: 25.0000 ug | INTRAMUSCULAR | Status: DC | PRN

## 2011-02-25 MED ORDER — PROMETHAZINE HCL 25 MG/ML IJ SOLN: 6.2500 mg | INTRAMUSCULAR | Status: DC | PRN

## 2011-02-25 MED ORDER — CITRIC ACID-SODIUM CITRATE 334-500 MG/5ML PO SOLN: 30.0000 mL | Freq: Once | ORAL | Status: DC | PRN

## 2011-02-25 MED ORDER — FAMOTIDINE 20 MG PO TABS: 20.0000 mg | ORAL_TABLET | Freq: Once | ORAL | Status: DC | PRN

## 2011-02-25 MED ORDER — DOXYCYCLINE HYCLATE 100 MG PO TABS: 100.0000 mg | ORAL_TABLET | Freq: Two times a day (BID) | ORAL | Status: AC

## 2011-02-25 MED ORDER — PROPOFOL 10 MG/ML IV EMUL
INTRAVENOUS | Status: DC | PRN
  Administered 2011-02-25: 140 mg via INTRAVENOUS
  Administered 2011-02-25: 20 mg via INTRAVENOUS

## 2011-02-25 MED ORDER — SCOPOLAMINE 1 MG/3DAYS TD PT72: 1.0000 | MEDICATED_PATCH | Freq: Once | TRANSDERMAL | Status: DC | PRN

## 2011-02-25 MED ORDER — LIDOCAINE HCL (CARDIAC) 20 MG/ML IV SOLN
INTRAVENOUS | Status: AC
  Filled 2011-02-25: qty 5

## 2011-02-25 MED ORDER — KETOROLAC TROMETHAMINE 30 MG/ML IJ SOLN
INTRAMUSCULAR | Status: AC
  Filled 2011-02-25: qty 1

## 2011-02-25 MED ORDER — LIDOCAINE HCL (CARDIAC) 20 MG/ML IV SOLN
INTRAVENOUS | Status: DC | PRN
  Administered 2011-02-25: 20 mg via INTRAVENOUS

## 2011-02-25 MED ORDER — ACETAMINOPHEN 325 MG PO TABS: 325.0000 mg | ORAL_TABLET | ORAL | Status: DC | PRN

## 2011-02-25 MED ORDER — KETOROLAC TROMETHAMINE 30 MG/ML IJ SOLN
INTRAMUSCULAR | Status: DC | PRN
  Administered 2011-02-25: 30 mg via INTRAVENOUS

## 2011-02-25 MED ORDER — KETOROLAC TROMETHAMINE 30 MG/ML IJ SOLN: 15.0000 mg | Freq: Once | INTRAMUSCULAR | Status: DC | PRN

## 2011-02-25 MED ORDER — GLYCOPYRROLATE 0.2 MG/ML IJ SOLN
INTRAMUSCULAR | Status: AC
  Filled 2011-02-25: qty 1

## 2011-02-25 MED ORDER — PANTOPRAZOLE SODIUM 40 MG PO TBEC: 40.0000 mg | DELAYED_RELEASE_TABLET | Freq: Once | ORAL | Status: DC | PRN

## 2011-02-25 MED ORDER — DEXAMETHASONE SODIUM PHOSPHATE 10 MG/ML IJ SOLN
INTRAMUSCULAR | Status: AC
  Filled 2011-02-25: qty 1

## 2011-02-25 MED ORDER — MIDAZOLAM HCL 2 MG/2ML IJ SOLN
INTRAMUSCULAR | Status: AC
  Filled 2011-02-25: qty 2

## 2011-02-25 MED ORDER — ONDANSETRON HCL 4 MG/2ML IJ SOLN
INTRAMUSCULAR | Status: AC
  Filled 2011-02-25: qty 2

## 2011-02-25 MED ORDER — FENTANYL CITRATE 0.05 MG/ML IJ SOLN
INTRAMUSCULAR | Status: AC
  Filled 2011-02-25: qty 2

## 2011-02-25 SURGICAL SUPPLY — 21 items
CATH ROBINSON RED A/P 16FR (CATHETERS) ×2 IMPLANT
CLOTH BEACON ORANGE TIMEOUT ST (SAFETY) ×2 IMPLANT
DECANTER SPIKE VIAL GLASS SM (MISCELLANEOUS) ×2 IMPLANT
DRAPE UTILITY XL STRL (DRAPES) ×2 IMPLANT
GLOVE BIOGEL PI IND STRL 7.0 (GLOVE) ×1 IMPLANT
GLOVE BIOGEL PI INDICATOR 7.0 (GLOVE) ×1
GOWN PREVENTION PLUS LG XLONG (DISPOSABLE) ×2 IMPLANT
KIT BERKELEY 1ST TRIMESTER 3/8 (MISCELLANEOUS) ×2 IMPLANT
NDL SPNL 22GX3.5 QUINCKE BK (NEEDLE) ×1 IMPLANT
NEEDLE SPNL 22GX3.5 QUINCKE BK (NEEDLE) ×2 IMPLANT
NS IRRIG 1000ML POUR BTL (IV SOLUTION) ×2 IMPLANT
PACK VAGINAL MINOR WOMEN LF (CUSTOM PROCEDURE TRAY) ×2 IMPLANT
PAD PREP 24X48 CUFFED NSTRL (MISCELLANEOUS) ×2 IMPLANT
SET BERKELEY SUCTION TUBING (SUCTIONS) ×2 IMPLANT
SYR 20CC LL (SYRINGE) ×2 IMPLANT
TOWEL OR 17X24 6PK STRL BLUE (TOWEL DISPOSABLE) ×4 IMPLANT
VACURETTE 10 RIGID CVD (CANNULA) IMPLANT
VACURETTE 7MM CVD STRL WRAP (CANNULA) IMPLANT
VACURETTE 8 RIGID CVD (CANNULA) IMPLANT
VACURETTE 9 RIGID CVD (CANNULA) IMPLANT
WATER STERILE IRR 1000ML POUR (IV SOLUTION) ×2 IMPLANT

## 2011-02-25 NOTE — Anesthesia Postprocedure Evaluation (Signed)
Anesthesia Post Note  Patient: [name]  Procedure(s) Performed:  DILATATION AND EVACUATION (D&E) - with chromosome studies  Anesthesia type: MAC  Patient location: PACU  Post pain: Pain level controlled  Post assessment: Post-op Vital signs reviewed  Last Vitals:  Filed Vitals:   02/25/11 0942  BP: 133/76  Pulse:   Temp: 98.3 F (36.8 C)  Resp:     Post vital signs: Reviewed  Level of consciousness: sedated  Complications: No apparent anesthesia complications

## 2011-02-25 NOTE — Anesthesia Preprocedure Evaluation (Signed)
Anesthesia Evaluation  Name, MR# and DOB Patient awake  General Assessment Comment  Reviewed: Allergy & Precautions, H&P , Patient's Chart, lab work & pertinent test results and reviewed documented beta blocker date and time   History of Anesthesia Complications Negative for: history of anesthetic complications  Airway Mallampati: II TM Distance: >3 FB Neck ROM: full    Dental No notable dental hx    Pulmonaryneg pulmonary ROS    clear to auscultation  pulmonary exam normal   Cardiovascular Exercise Tolerance: Good regular Normal   Neuro/PsychNegative Neurological ROS Negative Psych ROS  GI/Hepatic/Renal negative GI ROS, negative Liver ROS, and negative Renal ROS (+)       Endo/Other  Negative Endocrine ROS (+)   Abdominal   Musculoskeletal  Hematology negative hematology ROS (+)   Peds  Reproductive/Obstetrics negative OB ROS   Anesthesia Other Findings             Anesthesia Physical Anesthesia Plan  ASA: I  Anesthesia Plan: MAC   Post-op Pain Management:    Induction:   Airway Management Planned:   Additional Equipment:   Intra-op Plan:   Post-operative Plan:   Informed Consent: I have reviewed the patients History and Physical, chart, labs and discussed the procedure including the risks, benefits and alternatives for the proposed anesthesia with the patient or authorized representative who has indicated his/her understanding and acceptance.   Dental Advisory Given  Plan Discussed with: CRNA and Surgeon  Anesthesia Plan Comments:         Anesthesia Quick Evaluation  

## 2011-02-25 NOTE — Transfer of Care (Signed)
Immediate Anesthesia Transfer of Care Note  Patient: [name]  Procedure(s) Performed:  DILATATION AND EVACUATION (D&E) - with chromosome studies  Patient Location: PACU  Anesthesia Type: MAC 2 Level of Consciousness: awake  Airway & Oxygen Therapy: Patient Spontanous Breathing and Patient connected to nasal cannula oxygen  Post-op Assessment: Report given to PACU RN and Post -op Vital signs reviewed and stable  Post vital signs: Reviewed and stable  Complications: No apparent anesthesia complications

## 2011-02-25 NOTE — Op Note (Signed)
Preoperative Diagnosis incomplete abortion first trimester Postoperative diagnosis the same Procedure is dilation and evacuation Anesthesia MAC and local Surgeon is Dr. Normand Sloop Asst. is none Complications none Procedure in detail The patient was taken to the operating room where she was given MAC anesthesia. She was placed in dorsal lithotomy position and prepped and draped in normal sterile fashion. In and out catheter was used to drain the bladder. This was examined and noted to have a 9 week size uterus with no adnexal masses. A bivalve was placed into the vagina. A tenaculum was placed on the cervix. The cervix was infiltrated with 20 cc 1% lidocaine paracervical block. The cervix then dilated with dilators up to 21.  A size 8 suction curettage was placed into the uterine cavity. A scant amount of products of conception was seen. The suction curettage was removed when a gritty texture was noted. A sharp curette was done along all walls  of the uterus. The suction curet was placed back into the uterine cavity. No further products of conception were obtained. Patient declined having chromosome analysis of the products of conception. Sponge lap and needle counts were correct. Patient back in stable condition. The patient understood to be the risks to be been on limited to bleeding infection damage to internal organs and perforation of the uterus and Asherman syndrome leading to infertility.

## 2011-02-25 NOTE — H&P (Signed)
Dictated [phone]

## 2011-02-26 NOTE — H&P (Signed)
NAMEJASELYN, [name]              ACCOUNT NO.:  [id number]  MEDICAL RECORD NO.:  [id number]  LOCATION:  [facility]                          [facility]ACILITY:  [facility]  PHYSICIAN:  Emersen Mascari A. Lourdez Mcgahan, M.D. DATE OF BIRTH:  1980/08/02  DATE OF ADMISSION:  02/25/2011 DATE OF DISCHARGE:                             HISTORY & PHYSICAL   HISTORY:  The patient is a 30 year old, gravida 4, para 1-0-2-1, who presented on February 20, 2011, in the office with a missed AB.  She was given Cytotec and __________, but did another ultrasound which still showed some questionable retained products versus blood clot and the patient __________ she will be going out of town.  PAST MEDICAL HISTORY:  Significant for asthma.  PAST SURGICAL HISTORY:  Significant for D and E.  MEDICATIONS:  Prenatal vitamins.  FAMILY HISTORY:  Unremarkable.  ALLERGIES:  The patient has been allergic to the ADHESIVE  __________.  PHYSICAL EXAMINATION:  VITAL SIGNS:  She is 204 pounds, blood pressure is 122/74 and temperature is 98.7. EYES:  Her pupils are equal. EARS:  Her hearing is normal. THROAT:  Her throat is clear. NECK:  Her thyroid is not enlarged. CARDIAC:  Her heart is regular rate and rhythm. LUNGS:  Her lungs are clear to auscultation bilaterally. BACK:  Her back has no CVA tenderness. ABDOMEN:  Nontender without the masses or organomegaly. EXTREMITIES:  No cyanosis, clubbing or edema. CERVIX.  Full vaginal exam, her cervix is long and closed with some brown discharge in the vault.  Uterus is normal space, size and consistency and nontender.  Her blood type is O positive.  ASSESSMENT:  Incomplete abortion.  PLAN:  D and E.  The patient understands the risk to be with not limited to bleeding, infection, damage to internal organs and perforation of the uterus like bowel and bladder.  PERTINENT LABORATORY DATA:  Her ultrasound showed a uterine length of 9.8 x 5.58 x 5.23 with normal ovaries.  There was a endometrial  mass with very little blood flow, color Doppler.     [name], M.D.     NAD/MEDQ  D:  02/25/2011  T:  02/25/2011  Job:  [id number]

## 2011-03-23 ENCOUNTER — Ambulatory Visit (HOSPITAL_COMMUNITY)
Admission: RE | Admit: 2011-03-23 | Discharge: 2011-03-23 | Disposition: A | Discharge status: Home / Self Care | Payer: 59 | Source: Ambulatory Visit | Attending: Obstetrics and Gynecology | Admitting: Obstetrics and Gynecology

## 2011-03-23 DIAGNOSIS — N96 Recurrent pregnancy loss: Secondary | ICD-10-CM | POA: Insufficient documentation

## 2011-03-24 ENCOUNTER — Other Ambulatory Visit: Payer: Self-pay | Admitting: Maternal and Fetal Medicine

## 2011-03-25 DEATH — deceased

## 2011-03-27 ENCOUNTER — Encounter (HOSPITAL_COMMUNITY): Payer: Self-pay | Admitting: Obstetrics and Gynecology

## 2011-04-06 ENCOUNTER — Telehealth (HOSPITAL_COMMUNITY): Payer: Self-pay | Admitting: MS"

## 2011-04-06 NOTE — Telephone Encounter (Signed)
Results of peripheral blood chromosome analysis apparently normal for patient (74, XX) and her partner, Jill Alexanders (30, XY). Results also normal for additional studies performed: protein S and C, beta-2-glycoprotein, and prothrombin II gene testing. Discussed results with patient.

## 2011-04-12 NOTE — Progress Notes (Signed)
MFM Consultation Note  [name] is a 30 year old G73P1A3 AA female who presents for consultation regarding her recurrent, spontaneous abortions. Many years ago [name] had an uncomplicated elective abortion. Then 9 years ago, she delivered at term after an uneventful prenatal course. Just recently she has experienced two spontaneous abortions at 5-[redacted] weeks gestation. With both, an ultrasound showed a fetal pole with a slow heart rate and days later a demise was discovered. She had a D&C for both.  A work-up so far has revealed all of the following tests to be normal: aCL, LAC, ANA, TSH, hgbA1c, Factor V Leiden, prolactin and total Protein C. The total Protein S was elevated but the concern is with deficiencies of Protein C and S. Yesterday, she underwent sonohysterography which was basically normal per patient's report.  We reviewed some of the known causes of recurrent pregnancy loss including anti-phospholipid syndrome, aneuploidy and uterine cavity abnormalities. We also know that after a full work-up, greater than half of couples do not find a cause.  After a long discussion, [name] decided to have blood drawn for their karyotypes and we repeated the Prot S, total and added Prot S, functional, Prot C, functional, Prothrombin gene mutation, and beta-2 microglobulins. We discussed the use of supplemental progesterone, baby ASA and heparin. Unfortunately, the data does not support their use in this clinical setting; however, progesterone and baby ASA are relatively benign medications and are used by some practitioners.   (Face-to-face consultation with patient: 30 min)

## 2011-06-08 ENCOUNTER — Other Ambulatory Visit (HOSPITAL_COMMUNITY): Payer: Self-pay | Admitting: Obstetrics and Gynecology

## 2011-06-22 LAB — CBC
HCT: 37 % (ref 36–46)
Platelets: 324 10*3/uL (ref 150–399)

## 2011-06-22 LAB — OB RESULTS CONSOLE HEPATITIS B SURFACE ANTIGEN: Hepatitis B Surface Ag: NEGATIVE

## 2011-06-22 LAB — OB RESULTS CONSOLE ANTIBODY SCREEN: Antibody Screen: NEGATIVE

## 2011-07-25 NOTE — L&D Delivery Note (Signed)
Delivery Note  Pt's labored progressed quickly after AROM at 1942 to complete cx at 2205.  SVD at 10:10 PM.  A viable female "John" was delivered via Vaginal, Spontaneous Delivery (Presentation: Right Occiput Anterior) w/ LNC x1 noted and reduced over head before delivery of body.  APGAR: 9, 9; weight 7 lb 0.7 oz (3195 g).   Placenta status: Intact, Spontaneous.  Cord: 3 vessels with the following complications: None.  Cord pH: n/a.  Anesthesia: None  Episiotomy: None Lacerations: 2nd degree Suture Repair: 3-0 monocryl Est. Blood Loss (mL): 350  Mom to postpartum.  Baby to nursery-stable. Skin to skin initiated immediately after delivery. Pt plans to BF and desires IP circ.  Richel Millspaugh H 01/23/2012, 4:25 PM (late entry).

## 2011-08-09 ENCOUNTER — Inpatient Hospital Stay (HOSPITAL_COMMUNITY)
Admission: AD | Admit: 2011-08-09 | Discharge: 2011-08-09 | Disposition: A | Discharge status: Home / Self Care | Payer: 59 | Source: Ambulatory Visit | Attending: Obstetrics and Gynecology | Admitting: Obstetrics and Gynecology

## 2011-08-09 ENCOUNTER — Encounter (HOSPITAL_COMMUNITY): Payer: Self-pay

## 2011-08-09 DIAGNOSIS — R109 Unspecified abdominal pain: Secondary | ICD-10-CM | POA: Insufficient documentation

## 2011-08-09 DIAGNOSIS — O99891 Other specified diseases and conditions complicating pregnancy: Secondary | ICD-10-CM | POA: Insufficient documentation

## 2011-08-09 LAB — URINALYSIS, ROUTINE W REFLEX MICROSCOPIC
Leukocytes, UA: NEGATIVE
Protein, ur: NEGATIVE mg/dL
Specific Gravity, Urine: 1.025 (ref 1.005–1.030)
Urobilinogen, UA: 0.2 mg/dL (ref 0.0–1.0)

## 2011-08-09 LAB — URINE MICROSCOPIC-ADD ON

## 2011-08-09 MED ORDER — IBUPROFEN 600 MG PO TABS: 600.0000 mg | ORAL_TABLET | Freq: Four times a day (QID) | ORAL | Status: AC | PRN

## 2011-08-09 MED ORDER — IBUPROFEN 600 MG PO TABS
600.0000 mg | ORAL_TABLET | Freq: Four times a day (QID) | ORAL | Status: DC | PRN
  Administered 2011-08-09: 600 mg via ORAL
  Filled 2011-08-09: qty 1

## 2011-08-09 MED ORDER — HYDROCODONE-ACETAMINOPHEN 7.5-750 MG PO TABS: 1.0000 | ORAL_TABLET | Freq: Four times a day (QID) | ORAL | Status: AC | PRN

## 2011-08-09 NOTE — Progress Notes (Signed)
Patient states she has been having lower abdominal cramping that is constant with periods of increase intensity. Is currently being treated for a yeast infection. No bleeding, nausea or vomiting.

## 2011-08-09 NOTE — Progress Notes (Signed)
History      31 yo g5p1 EDC 7/09 15 1/7 week IUP with c/o of cramping since yesterday, worse today had to leave work, denies vag bleeding, N,V,D, or constipation, feels pain lower abdomin and lower back Chief Complaint  Patient presents with  . Abdominal Pain   @SFHPI @  OB History    Grav Para Term Preterm Abortions TAB SAB Ect Mult Living   5 1 1  0 3 0 3 0 0 1      Past Medical History  Diagnosis Date  . No pertinent past medical history     Past Surgical History  Procedure Date  . Dilation and curettage of uterus 11/2010  . Dilation and evacuation 02/25/2011    Procedure: DILATATION AND EVACUATION (D&E);  Surgeon: Michael Litter, MD;  Location: [facility];  Service: Gynecology;  Laterality: N/A;  with chromosome studies    Family History  Problem Relation Age of Onset  . Anesthesia problems Neg Hx   . Malignant hyperthermia Neg Hx   . Hypotension Neg Hx   . Pseudochol deficiency Neg Hx     History  Substance Use Topics  . Smoking status: Never Smoker   . Smokeless tobacco: Not on file  . Alcohol Use: No    Allergies:  Allergies  Allergen Reactions  . Ortho Evra (Norelgestromin-Eth Estradiol) Hives    Prescriptions prior to admission  Medication Sig Dispense Refill  . Biotin 10 MG TABS Take by mouth.        Marland Kitchen ibuprofen (ADVIL,MOTRIN) 600 MG tablet Take 600 mg by mouth every 6 (six) hours as needed.        . ondansetron (ZOFRAN) 8 MG tablet Take 8 mg by mouth every 6 (six) hours as needed.        . promethazine (PHENERGAN) 12.5 MG tablet Take 12.5-25 mg by mouth every 6 (six) hours as needed.         @ROS @ Physical Exam   Blood pressure 139/58, pulse 74, temperature 98.8 F (37.1 C), temperature source Oral, height 5\' 6"  (1.676 m), weight 215 lb 3.2 oz (97.614 kg), SpO2 99.00%. abd soft, gravid, nt Vag cervix LTC posterior UA pending A:15 1/7 week IUP     Hx of recurrent SAB P ua pending, motrin now, collaboration with Dr. Normand Sloop per telephone. Lavera Guise, CNM  ED Course   UA neg, discussed assessment with Dr. Normand Sloop per telephone, offered cervical length [place] and declines wants to go home now will report if symptoms change or worsen, bleeding, or without pain relief. F/o office as scheduled. Lavera Guise, CNM

## 2011-09-27 ENCOUNTER — Other Ambulatory Visit (INDEPENDENT_AMBULATORY_CARE_PROVIDER_SITE_OTHER): Payer: 59

## 2011-09-27 DIAGNOSIS — Z331 Pregnant state, incidental: Secondary | ICD-10-CM

## 2011-10-04 ENCOUNTER — Inpatient Hospital Stay (HOSPITAL_COMMUNITY)
Admission: AD | Admit: 2011-10-04 | Discharge: 2011-10-04 | Disposition: A | Discharge status: Home / Self Care | Payer: 59 | Source: Ambulatory Visit | Attending: Obstetrics and Gynecology | Admitting: Obstetrics and Gynecology

## 2011-10-04 ENCOUNTER — Encounter (HOSPITAL_COMMUNITY): Payer: Self-pay | Admitting: *Deleted

## 2011-10-04 DIAGNOSIS — N390 Urinary tract infection, site not specified: Secondary | ICD-10-CM | POA: Insufficient documentation

## 2011-10-04 DIAGNOSIS — O2390 Unspecified genitourinary tract infection in pregnancy, unspecified trimester: Secondary | ICD-10-CM | POA: Insufficient documentation

## 2011-10-04 DIAGNOSIS — O26859 Spotting complicating pregnancy, unspecified trimester: Secondary | ICD-10-CM

## 2011-10-04 DIAGNOSIS — O4690 Antepartum hemorrhage, unspecified, unspecified trimester: Secondary | ICD-10-CM | POA: Insufficient documentation

## 2011-10-04 LAB — WET PREP, GENITAL
Clue Cells Wet Prep HPF POC: NONE SEEN
Trich, Wet Prep: NONE SEEN

## 2011-10-04 LAB — URINALYSIS, ROUTINE W REFLEX MICROSCOPIC
Glucose, UA: NEGATIVE mg/dL
Ketones, ur: NEGATIVE mg/dL
Protein, ur: NEGATIVE mg/dL
Urobilinogen, UA: 0.2 mg/dL (ref 0.0–1.0)

## 2011-10-04 LAB — URINE MICROSCOPIC-ADD ON

## 2011-10-04 MED ORDER — TERCONAZOLE 80 MG VA SUPP: 80.0000 mg | Freq: Every day | VAGINAL | Status: AC

## 2011-10-04 NOTE — Discharge Instructions (Signed)

## 2011-10-04 NOTE — MAU Note (Signed)
Pt had sexual intercourse last night and started having bright red vaginal bleeding today at 1130.  Pt denies any ROM.

## 2011-10-04 NOTE — MAU Note (Signed)
Pt needs to know if she should continue with the ASA.  Dr. Normand Sloop prescribe the ASA and progesterone around 10 wks but pt hasn't been able to see Dr. Normand Sloop the last couple of months.  Pt also was told she needs to have a blood gtt since she vomited last time.  Pt also C/O lots of thick greenish discharge but without odor.

## 2011-10-04 NOTE — MAU Provider Note (Signed)
History     CSN: [id number]  Arrival date and time: 10/04/11 1200   None     Chief Complaint  Patient presents with  . Vaginal Bleeding  . Routine Prenatal Visit   HPI Comments: PT is a 31yo [id number] at [redacted]w[redacted]d with c/o BRB this AM. Pt did have IC last night. No bleeding on pad right now, denies LOF, no pain or cramping. 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 Also c/o increased D/C the last few days. Denies itching/burning/odor, no dysuria.  Pregnancy significant for: 1. Hx abnl pap 2. Recurrent SAB on ASA      Past Medical History  Diagnosis Date  . No pertinent past medical history   . Asthma     Past Surgical History  Procedure Date  . Dilation and curettage of uterus 11/2010  . Dilation and evacuation 02/25/2011    Procedure: DILATATION AND EVACUATION (D&E);  Surgeon: Michael Litter, MD;  Location: [facility];  Service: Gynecology;  Laterality: N/A;  with chromosome studies    Family History  Problem Relation Age of Onset  . Anesthesia problems Neg Hx   . Malignant hyperthermia Neg Hx   . Hypotension Neg Hx   . Pseudochol deficiency Neg Hx   . Asthma Mother   . Diabetes Sister     History  Substance Use Topics  . Smoking status: Never Smoker   . Smokeless tobacco: Not on file  . Alcohol Use: No    Allergies:  Allergies  Allergen Reactions  . Ortho Evra (Norelgestromin-Eth Estradiol) Hives    Prescriptions prior to admission  Medication Sig Dispense Refill  . aspirin 81 MG tablet Take 160 mg by mouth daily.      . Prenatal Vit-Fe Fumarate-FA (PRENATAL MULTIVITAMIN) TABS Take 1 tablet by mouth daily.        Review of Systems  All other systems reviewed and are negative.   Physical Exam   Blood pressure 132/78, pulse 65, temperature 98.6 F (37 C), temperature source Oral, resp. rate 20, height 5\' 7"  (1.702 m), weight 105.416 kg (232 lb 6.4 oz), SpO2 100.00%.  Physical Exam  Nursing note and vitals  reviewed. Constitutional: She is oriented to person, place, and time. She appears well-developed and well-nourished.  HENT:  Head: Normocephalic.  Neck: Normal range of motion.  Cardiovascular: Normal rate.   Respiratory: Effort normal.  GI: Soft. There is no tenderness.  Genitourinary: Vaginal discharge found.       lg amt greenish d/c VE=FT/th/high  Musculoskeletal: Normal range of motion. She exhibits no edema.  Neurological: She is alert and oriented to person, place, and time.  Skin: Skin is warm and dry.  Psychiatric: She has a normal mood and affect. Her behavior is normal.   FHR 150, mild variable decel, reassuring for GA toco quiet  MAU Course  Procedures    Assessment and Plan  IUP at [redacted]w[redacted]d BRB after IC FHR reassuring for GA Pt to continue ASA until 26wks per Dr Normand Sloop Pt has zofran and phenergan at home, will take zofran prior to NV, and redo the 1hr GTT, pt has appt on 3-26  UA Wet prep GC/CT  Marlina Cataldi M 10/04/2011, 1:54 PM   Addendum Results for orders placed during the hospital encounter of 10/04/11 (from the past 24 hour(s))  WET PREP, GENITAL     Status: Abnormal   Collection Time   10/04/11  1:36 PM  Component Value Range   Yeast Wet Prep HPF POC FEW (*) NONE SEEN    Trich, Wet Prep NONE SEEN  NONE SEEN    Clue Cells Wet Prep HPF POC NONE SEEN  NONE SEEN    WBC, Wet Prep HPF POC FEW (*) NONE SEEN   URINALYSIS, ROUTINE W REFLEX MICROSCOPIC     Status: Abnormal   Collection Time   10/04/11  1:50 PM      Component Value Range   Color, Urine YELLOW  YELLOW    APPearance CLEAR  CLEAR    Specific Gravity, Urine 1.025  1.005 - 1.030    pH 6.5  5.0 - 8.0    Glucose, UA NEGATIVE  NEGATIVE (mg/dL)   Hgb urine dipstick TRACE (*) NEGATIVE    Bilirubin Urine NEGATIVE  NEGATIVE    Ketones, ur NEGATIVE  NEGATIVE (mg/dL)   Protein, ur NEGATIVE  NEGATIVE (mg/dL)   Urobilinogen, UA 0.2  0.0 - 1.0 (mg/dL)   Nitrite NEGATIVE  NEGATIVE     Leukocytes, UA MODERATE (*) NEGATIVE   URINE MICROSCOPIC-ADD ON     Status: Abnormal   Collection Time   10/04/11  1:50 PM      Component Value Range   Squamous Epithelial / LPF FEW (*) RARE    WBC, UA 11-20  <3 (WBC/hpf)   RBC / HPF 3-6  <3 (RBC/hpf)   Bacteria, UA MANY (*) RARE    Urine-Other MUCOUS PRESENT     UA pos leuk ? UTI - will wait for cx results Pos yeast on wet prep  D/C home terazol 3 Pelvic rest F/u as sched at office 3-26 Preterm labor precautions  S.Bayle Calvo, CNM

## 2011-10-11 ENCOUNTER — Encounter (INDEPENDENT_AMBULATORY_CARE_PROVIDER_SITE_OTHER): Payer: 59 | Admitting: Obstetrics and Gynecology

## 2011-10-11 ENCOUNTER — Other Ambulatory Visit: Payer: Self-pay | Admitting: Obstetrics and Gynecology

## 2011-10-11 DIAGNOSIS — R58 Hemorrhage, not elsewhere classified: Secondary | ICD-10-CM

## 2011-10-11 DIAGNOSIS — D219 Benign neoplasm of connective and other soft tissue, unspecified: Secondary | ICD-10-CM

## 2011-10-11 DIAGNOSIS — Z3480 Encounter for supervision of other normal pregnancy, unspecified trimester: Secondary | ICD-10-CM

## 2011-10-12 ENCOUNTER — Ambulatory Visit (HOSPITAL_COMMUNITY)
Admission: RE | Admit: 2011-10-12 | Discharge: 2011-10-12 | Disposition: A | Discharge status: Home / Self Care | Payer: 59 | Source: Ambulatory Visit | Attending: Obstetrics and Gynecology | Admitting: Obstetrics and Gynecology

## 2011-10-12 DIAGNOSIS — Z3689 Encounter for other specified antenatal screening: Secondary | ICD-10-CM | POA: Insufficient documentation

## 2011-10-12 DIAGNOSIS — R58 Hemorrhage, not elsewhere classified: Secondary | ICD-10-CM

## 2011-10-12 DIAGNOSIS — D219 Benign neoplasm of connective and other soft tissue, unspecified: Secondary | ICD-10-CM

## 2011-10-17 ENCOUNTER — Other Ambulatory Visit: Payer: 59

## 2011-10-17 ENCOUNTER — Encounter (INDEPENDENT_AMBULATORY_CARE_PROVIDER_SITE_OTHER): Payer: 59 | Admitting: Obstetrics and Gynecology

## 2011-10-17 DIAGNOSIS — Z3480 Encounter for supervision of other normal pregnancy, unspecified trimester: Secondary | ICD-10-CM

## 2011-10-17 DIAGNOSIS — N390 Urinary tract infection, site not specified: Secondary | ICD-10-CM

## 2011-10-26 ENCOUNTER — Other Ambulatory Visit: Payer: Self-pay | Admitting: Obstetrics and Gynecology

## 2011-10-26 ENCOUNTER — Encounter (INDEPENDENT_AMBULATORY_CARE_PROVIDER_SITE_OTHER): Payer: 59 | Admitting: Obstetrics and Gynecology

## 2011-10-26 DIAGNOSIS — Z331 Pregnant state, incidental: Secondary | ICD-10-CM

## 2011-10-26 DIAGNOSIS — O209 Hemorrhage in early pregnancy, unspecified: Secondary | ICD-10-CM

## 2011-10-27 ENCOUNTER — Encounter: Payer: 59 | Admitting: Obstetrics and Gynecology

## 2011-11-01 ENCOUNTER — Telehealth: Payer: Self-pay

## 2011-11-01 ENCOUNTER — Other Ambulatory Visit: Payer: 59 | Admitting: Obstetrics and Gynecology

## 2011-11-01 ENCOUNTER — Other Ambulatory Visit: Payer: 59

## 2011-11-02 ENCOUNTER — Ambulatory Visit (INDEPENDENT_AMBULATORY_CARE_PROVIDER_SITE_OTHER): Payer: 59 | Admitting: Obstetrics and Gynecology

## 2011-11-02 ENCOUNTER — Encounter: Payer: Self-pay | Admitting: Obstetrics and Gynecology

## 2011-11-02 ENCOUNTER — Ambulatory Visit (INDEPENDENT_AMBULATORY_CARE_PROVIDER_SITE_OTHER): Payer: 59

## 2011-11-02 ENCOUNTER — Other Ambulatory Visit: Payer: 59

## 2011-11-02 VITALS — BP 120/66 | Wt 240.0 lb

## 2011-11-02 DIAGNOSIS — IMO0002 Reserved for concepts with insufficient information to code with codable children: Secondary | ICD-10-CM

## 2011-11-02 DIAGNOSIS — O2620 Pregnancy care for patient with recurrent pregnancy loss, unspecified trimester: Secondary | ICD-10-CM

## 2011-11-02 DIAGNOSIS — R87612 Low grade squamous intraepithelial lesion on cytologic smear of cervix (LGSIL): Secondary | ICD-10-CM

## 2011-11-02 DIAGNOSIS — Z331 Pregnant state, incidental: Secondary | ICD-10-CM

## 2011-11-02 DIAGNOSIS — O209 Hemorrhage in early pregnancy, unspecified: Secondary | ICD-10-CM

## 2011-11-02 HISTORY — DX: Reserved for concepts with insufficient information to code with codable children: IMO0002

## 2011-11-02 LAB — US OB FOLLOW UP

## 2011-11-02 NOTE — Progress Notes (Signed)
Patient ID: [name], female   DOB: 1980-09-25, 31 y.o.   MRN: [id number] Pt without complaint. No LOF,or contractions.  Pt with occ spotting [place] EFW 944g 87% cx 3.86cm SIUP vtx abt g0 placenta Results for orders placed in visit on 11/02/11  GLUCOSE TOLERANCE, 1 HOUR      Component Value Range   Glucose 1 Hr Prenatal, POC 119    Hgb 11.1 RPR NR 1. S>D [place] baby at 87%.  Diet reviewed with pt 2. Second and third trimester bleeding.  The spotting is sporadic.  No signs of abruption or previa.  Pt stable plan growth [place] q 4 weeks an bleeding precautions given to pt 3.  Pap WNL 4.  Stop ASA @ NV

## 2011-11-02 NOTE — Telephone Encounter (Signed)
Spoke with pt rgd msg pt already r/s ultrasound

## 2011-11-02 NOTE — Patient Instructions (Signed)
Exercise During Pregnancy It is possible to maintain a healthy exercise program throughout a pregnancy. However, it is important to discuss exercising with your caregiver, so that the two of you may develop an appropriate exercise program. It is important to remember that exercise during pregnancy should be use to maintain one's health and not to lose weight. Strenuous activities should be avoided as the may cause the baby to have difficulty obtaining proper amounts of oxygen. A proper pregnancy exercise plan has many benefits including preparing you for the physical challenges of childbirth by strengthening the muscles that help with childbirth, reducing common backaches, alleviating constipation, improving posture, elevating mood, and promoting better sleep. If possible, begin exercising regularly before you become pregnant and continue through the duration of the pregnancy. It is difficult for a woman to begin an exercise program later in a pregnancy due to enlargement of the uterus and breasts and a shift in the center of gravity. Pregnancy exercise programs should be aimed at improving the muscles of the heart, back, pelvis, and abdomen. GENERAL GUIDELINES  Every woman and pregnancy is different; thus, the level of exercise you can do depends on your health, the conditions of the pregnancy, and activity level before pregnancy. For women who were sedentary before pregnancy, walking is a good way to begin. Use caution while participation in sports during pregnancy, because your center of gravity changes and may affect your balance or that require rapid movements. Always make sure to drink plenty of fluids to avoid dehydration, which may decrease blood flow to your baby. Avoid any activity that has the potential for trauma to the abdomen. If possible try to avoid high-altitude activities, which can deprive you and your baby of oxygen; this may cause premature labor. Talk with your caregiver.  Performing a  proper warm up and cool down are very important. It is important to start slowly and build up to more demanding exercises. Toward the end of an exercise session, gradually slow your activity. Perhaps try and work back through the exercises in reverse order. Check your pulse during peak activity, and discuss with your caregiver an appropriate range of heart rate for activity. Slow down your activity if your heart starts beating faster than the target range recommended by your health care provider. Do not exceed a heart rate of 140 beats per minute. Exercise that is too strenuous may speed up the baby's heartbeat to a dangerous level. In general, if you are able to carry on a conversation comfortably while exercising, your heart rate is probably within the recommended limits. Check to make sure.  You should stop exercising and call your health care provider if you have any unusual symptoms, such as pain, uterine contractions, chest pain, bleeding or fluid leakage from the vagina, dizziness, or shortness of breath. Talk to your health caregiver if you have any questions.  Document Released: 07/10/2005 Document Revised: 06/29/2011 Document Reviewed: 10/22/2008 Exit[employer] Patient Information 2012 [employer]. 

## 2011-11-17 ENCOUNTER — Encounter: Payer: 59 | Admitting: Obstetrics and Gynecology

## 2011-11-17 ENCOUNTER — Encounter: Payer: Self-pay | Admitting: Obstetrics and Gynecology

## 2011-11-17 ENCOUNTER — Ambulatory Visit (INDEPENDENT_AMBULATORY_CARE_PROVIDER_SITE_OTHER): Payer: 59 | Admitting: Obstetrics and Gynecology

## 2011-11-17 VITALS — BP 134/42 | Wt 244.0 lb

## 2011-11-17 DIAGNOSIS — O4693 Antepartum hemorrhage, unspecified, third trimester: Secondary | ICD-10-CM

## 2011-11-17 DIAGNOSIS — O4690 Antepartum hemorrhage, unspecified, unspecified trimester: Secondary | ICD-10-CM

## 2011-11-17 DIAGNOSIS — E669 Obesity, unspecified: Secondary | ICD-10-CM | POA: Insufficient documentation

## 2011-11-17 HISTORY — DX: Obesity, unspecified: E66.9

## 2011-11-17 NOTE — Progress Notes (Signed)
The patient complains of swelling in her ankles.  She would discontinue taking baby aspirin.  No bleeding since last visit.  Return to office in 2 weeks.  Ultrasound next visit.  Dr. Stefano Gaul

## 2011-11-27 ENCOUNTER — Ambulatory Visit (INDEPENDENT_AMBULATORY_CARE_PROVIDER_SITE_OTHER): Payer: 59

## 2011-11-27 ENCOUNTER — Encounter: Payer: Self-pay | Admitting: Obstetrics and Gynecology

## 2011-11-27 ENCOUNTER — Ambulatory Visit (INDEPENDENT_AMBULATORY_CARE_PROVIDER_SITE_OTHER): Payer: 59 | Admitting: Obstetrics and Gynecology

## 2011-11-27 ENCOUNTER — Other Ambulatory Visit: Payer: Self-pay | Admitting: Obstetrics and Gynecology

## 2011-11-27 VITALS — BP 104/48

## 2011-11-27 DIAGNOSIS — O4693 Antepartum hemorrhage, unspecified, third trimester: Secondary | ICD-10-CM

## 2011-11-27 DIAGNOSIS — O4690 Antepartum hemorrhage, unspecified, unspecified trimester: Secondary | ICD-10-CM

## 2011-11-27 NOTE — Progress Notes (Signed)
EFW:  1450grams   3-3#,   69% AFI:18.1cm Positionvtx Placenta location: anterior, grade 0 placenta S>D US shows normal growth Stop ASA FKC reviewed

## 2011-11-28 ENCOUNTER — Encounter: Payer: Self-pay | Admitting: Obstetrics and Gynecology

## 2011-12-11 ENCOUNTER — Encounter: Payer: 59 | Admitting: Obstetrics and Gynecology

## 2011-12-14 ENCOUNTER — Ambulatory Visit (INDEPENDENT_AMBULATORY_CARE_PROVIDER_SITE_OTHER): Payer: 59 | Admitting: Obstetrics and Gynecology

## 2011-12-14 ENCOUNTER — Encounter: Payer: Self-pay | Admitting: Obstetrics and Gynecology

## 2011-12-14 VITALS — BP 110/50 | Wt 246.0 lb

## 2011-12-14 DIAGNOSIS — Z331 Pregnant state, incidental: Secondary | ICD-10-CM

## 2011-12-14 NOTE — Progress Notes (Signed)
Pt c/o swelling and in feet.  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  She need to rest in between pts. occ with SOB no CP Physical Examination: General appearance - alert, well appearing, and in no distress Chest - clear to auscultation, no wheezes, rales or rhonchi, symmetric air entry Heart - normal rate and regular rhythm Extremities - peripheral pulses normal, no pedal edema, no clubbing or cyanosis, pedal edema 2 +.  No calf tenderness bilaterally Pt desires to continue current work schedule Us@nv  s>d

## 2011-12-14 NOTE — Progress Notes (Signed)
Pt c/o pressure, no other concerns.

## 2011-12-21 ENCOUNTER — Telehealth: Payer: Self-pay | Admitting: Obstetrics and Gynecology

## 2011-12-21 ENCOUNTER — Inpatient Hospital Stay (HOSPITAL_COMMUNITY)
Admission: AD | Admit: 2011-12-21 | Discharge: 2011-12-22 | DRG: 781 | Disposition: A | Discharge status: Home / Self Care | Payer: 59 | Source: Ambulatory Visit | Attending: Obstetrics and Gynecology | Admitting: Obstetrics and Gynecology

## 2011-12-21 ENCOUNTER — Encounter (HOSPITAL_COMMUNITY): Payer: Self-pay

## 2011-12-21 DIAGNOSIS — R109 Unspecified abdominal pain: Secondary | ICD-10-CM

## 2011-12-21 DIAGNOSIS — J45901 Unspecified asthma with (acute) exacerbation: Secondary | ICD-10-CM | POA: Diagnosis present

## 2011-12-21 DIAGNOSIS — R600 Localized edema: Secondary | ICD-10-CM

## 2011-12-21 DIAGNOSIS — Z3490 Encounter for supervision of normal pregnancy, unspecified, unspecified trimester: Secondary | ICD-10-CM

## 2011-12-21 DIAGNOSIS — J4520 Mild intermittent asthma, uncomplicated: Secondary | ICD-10-CM

## 2011-12-21 DIAGNOSIS — R609 Edema, unspecified: Secondary | ICD-10-CM | POA: Diagnosis present

## 2011-12-21 DIAGNOSIS — Z331 Pregnant state, incidental: Secondary | ICD-10-CM

## 2011-12-21 DIAGNOSIS — O99891 Other specified diseases and conditions complicating pregnancy: Principal | ICD-10-CM | POA: Diagnosis present

## 2011-12-21 HISTORY — DX: Localized edema: R60.0

## 2011-12-21 LAB — DIFFERENTIAL
Basophils Relative: 0 % (ref 0–1)
Eosinophils Relative: 0 % (ref 0–5)
Monocytes Absolute: 0.3 10*3/uL (ref 0.1–1.0)
Monocytes Relative: 3 % (ref 3–12)
Neutro Abs: 9.2 10*3/uL — ABNORMAL HIGH (ref 1.7–7.7)

## 2011-12-21 LAB — COMPREHENSIVE METABOLIC PANEL
Albumin: 2.7 g/dL — ABNORMAL LOW (ref 3.5–5.2)
BUN: 6 mg/dL (ref 6–23)
Calcium: 9.1 mg/dL (ref 8.4–10.5)
Chloride: 100 mEq/L (ref 96–112)
Creatinine, Ser: 0.51 mg/dL (ref 0.50–1.10)
GFR calc non Af Amer: >90 mL/min (ref >90)
Total Bilirubin: 0.2 mg/dL — ABNORMAL LOW (ref 0.3–1.2)

## 2011-12-21 LAB — CBC
HCT: 33.8 % — ABNORMAL LOW (ref 36.0–46.0)
Hemoglobin: 11.5 g/dL — ABNORMAL LOW (ref 12.0–15.0)
MCH: 30.7 pg (ref 26.0–34.0)
MCHC: 34 g/dL (ref 30.0–36.0)
MCV: 90.4 fL (ref 78.0–100.0)

## 2011-12-21 MED ORDER — DOCUSATE SODIUM 100 MG PO CAPS
100.0000 mg | ORAL_CAPSULE | Freq: Every day | ORAL | Status: DC
Start: 2011-12-21 — End: 2011-12-22
  Administered 2011-12-22: 100 mg via ORAL
  Filled 2011-12-21: qty 1

## 2011-12-21 MED ORDER — ALBUTEROL SULFATE (5 MG/ML) 0.5% IN NEBU
5.0000 mg | INHALATION_SOLUTION | RESPIRATORY_TRACT | Status: DC | PRN
Start: 2011-12-21 — End: 2011-12-21
  Filled 2011-12-21: qty 1

## 2011-12-21 MED ORDER — TERBUTALINE SULFATE 1 MG/ML IJ SOLN
INTRAMUSCULAR | Status: AC
  Filled 2011-12-21: qty 1

## 2011-12-21 MED ORDER — FAMOTIDINE IN NACL 20-0.9 MG/50ML-% IV SOLN
20.0000 mg | Freq: Once | INTRAVENOUS | Status: AC
  Administered 2011-12-21: 20 mg via INTRAVENOUS
  Filled 2011-12-21: qty 50

## 2011-12-21 MED ORDER — TERBUTALINE SULFATE 1 MG/ML IJ SOLN
0.3000 mg | Freq: Once | INTRAMUSCULAR | Status: AC
  Administered 2011-12-21: 0.3 mg via SUBCUTANEOUS

## 2011-12-21 MED ORDER — IPRATROPIUM BROMIDE 0.02 % IN SOLN
0.5000 mg | RESPIRATORY_TRACT | Status: AC
  Administered 2011-12-21: 0.5 mg via RESPIRATORY_TRACT
  Filled 2011-12-21: qty 2.5

## 2011-12-21 MED ORDER — PRENATAL MULTIVITAMIN CH
1.0000 | ORAL_TABLET | Freq: Every day | ORAL | Status: DC
  Administered 2011-12-22: 1 via ORAL
  Filled 2011-12-21: qty 1

## 2011-12-21 MED ORDER — MAGNESIUM SULFATE 40 G IN LACTATED RINGERS - SIMPLE: 4.0000 g/h | INTRAVENOUS | Status: DC

## 2011-12-21 MED ORDER — ALBUTEROL SULFATE (5 MG/ML) 0.5% IN NEBU
2.5000 mg | INHALATION_SOLUTION | Freq: Once | RESPIRATORY_TRACT | Status: AC
  Administered 2011-12-21: 2.5 mg via RESPIRATORY_TRACT

## 2011-12-21 MED ORDER — IPRATROPIUM BROMIDE 0.02 % IN SOLN
0.5000 mg | RESPIRATORY_TRACT | Status: DC | PRN
  Filled 2011-12-21: qty 2.5

## 2011-12-21 MED ORDER — LACTATED RINGERS IV SOLN
INTRAVENOUS | Status: DC
  Administered 2011-12-22: 50 mL/h via INTRAVENOUS

## 2011-12-21 MED ORDER — METHYLPREDNISOLONE SODIUM SUCC 125 MG IJ SOLR
250.0000 mg | Freq: Once | INTRAMUSCULAR | Status: AC
  Administered 2011-12-21: 250 mg via INTRAVENOUS
  Filled 2011-12-21: qty 4

## 2011-12-21 MED ORDER — MAGNESIUM SULFATE BOLUS VIA INFUSION
4.0000 g | Freq: Once | INTRAVENOUS | Status: DC
  Filled 2011-12-21: qty 500

## 2011-12-21 MED ORDER — IPRATROPIUM BROMIDE 0.02 % IN SOLN
RESPIRATORY_TRACT | Status: AC
  Administered 2011-12-21: 0.5 mg via RESPIRATORY_TRACT
  Filled 2011-12-21: qty 2.5

## 2011-12-21 MED ORDER — PREDNISOLONE 5 MG PO TABS: 40.0000 mg | ORAL_TABLET | Freq: Every day | ORAL | Status: DC

## 2011-12-21 MED ORDER — ALBUTEROL SULFATE (5 MG/ML) 0.5% IN NEBU
2.5000 mg | INHALATION_SOLUTION | RESPIRATORY_TRACT | Status: AC
  Administered 2011-12-21: 2.5 mg via RESPIRATORY_TRACT
  Filled 2011-12-21: qty 0.5

## 2011-12-21 MED ORDER — TERBUTALINE SULFATE 1 MG/ML IJ SOLN: 0.2500 mg | Freq: Once | INTRAMUSCULAR | Status: DC

## 2011-12-21 MED ORDER — PREDNISONE 20 MG PO TABS
40.0000 mg | ORAL_TABLET | Freq: Every day | ORAL | Status: DC
  Administered 2011-12-22: 40 mg via ORAL
  Filled 2011-12-21 (×2): qty 2

## 2011-12-21 MED ORDER — METHYLPREDNISOLONE SODIUM SUCC 125 MG IJ SOLR: 250.0000 mg | Freq: Once | INTRAMUSCULAR | Status: DC

## 2011-12-21 MED ORDER — MAGNESIUM SULFATE BOLUS VIA INFUSION
2.0000 g | Freq: Once | INTRAVENOUS | Status: DC
  Filled 2011-12-21: qty 500

## 2011-12-21 MED ORDER — CALCIUM CARBONATE ANTACID 500 MG PO CHEW: 2.0000 | CHEWABLE_TABLET | ORAL | Status: DC | PRN

## 2011-12-21 MED ORDER — ZOLPIDEM TARTRATE 10 MG PO TABS: 10.0000 mg | ORAL_TABLET | Freq: Every evening | ORAL | Status: DC | PRN

## 2011-12-21 MED ORDER — MAGNESIUM SULFATE 40 G IN LACTATED RINGERS - SIMPLE
4.0000 g/h | INTRAVENOUS | Status: DC
  Administered 2011-12-21 – 2011-12-22 (×2): 4 g/h via INTRAVENOUS
  Filled 2011-12-21 (×2): qty 500

## 2011-12-21 MED ORDER — IPRATROPIUM BROMIDE 0.02 % IN SOLN
0.5000 mg | Freq: Once | RESPIRATORY_TRACT | Status: AC
  Administered 2011-12-21: 0.5 mg via RESPIRATORY_TRACT

## 2011-12-21 MED ORDER — FLUTICASONE-SALMETEROL 500-50 MCG/DOSE IN AEPB
1.0000 | INHALATION_SPRAY | Freq: Two times a day (BID) | RESPIRATORY_TRACT | Status: DC
  Administered 2011-12-21 – 2011-12-22 (×2): 1 via RESPIRATORY_TRACT
  Filled 2011-12-21: qty 14

## 2011-12-21 MED ORDER — ACETAMINOPHEN 325 MG PO TABS: 650.0000 mg | ORAL_TABLET | ORAL | Status: DC | PRN

## 2011-12-21 MED ORDER — ALBUTEROL SULFATE (5 MG/ML) 0.5% IN NEBU
2.5000 mg | INHALATION_SOLUTION | RESPIRATORY_TRACT | Status: DC
  Administered 2011-12-22 (×5): 2.5 mg via RESPIRATORY_TRACT
  Filled 2011-12-21 (×10): qty 0.5

## 2011-12-21 MED ORDER — ALBUTEROL SULFATE (5 MG/ML) 0.5% IN NEBU
INHALATION_SOLUTION | RESPIRATORY_TRACT | Status: AC
  Filled 2011-12-21: qty 0.5

## 2011-12-21 MED ORDER — MAGNESIUM SULFATE 40 G IN LACTATED RINGERS - SIMPLE: 2.0000 g/h | Freq: Once | INTRAVENOUS | Status: DC

## 2011-12-21 MED ORDER — IPRATROPIUM BROMIDE 0.02 % IN SOLN
0.5000 mg | RESPIRATORY_TRACT | Status: AC
Start: 2011-12-21 — End: 2011-12-21
  Administered 2011-12-21: 0.5 mg via RESPIRATORY_TRACT
  Filled 2011-12-21: qty 2.5

## 2011-12-21 MED ORDER — ALBUTEROL SULFATE (5 MG/ML) 0.5% IN NEBU
2.5000 mg | INHALATION_SOLUTION | RESPIRATORY_TRACT | Status: DC | PRN
  Filled 2011-12-21: qty 0.5

## 2011-12-21 MED ORDER — ALBUTEROL SULFATE (5 MG/ML) 0.5% IN NEBU
5.0000 mg | INHALATION_SOLUTION | RESPIRATORY_TRACT | Status: DC
  Filled 2011-12-21: qty 1

## 2011-12-21 NOTE — Consult Note (Signed)
Name: [name] MRN: [id number] DOB: 1981-06-15    LOS: 0  Referring Provider:  Su Hilt Reason for Referral:  Asthma exacerbation  PULMONARY / CRITICAL CARE MEDICINE  HPI:  31 yo [redacted] weeks pregnant with mild intermittent asthma (takes Albuterol PRN, last several weeks ago), never intubated, admitted to [facility] with acute asthma attack.  Reports acute onset of chest tightness, wheezing and dyspnea this afternoon.  She does report feeling hot and presyncopal, but that resolved quickly. There were no specific triggers. There was no fever, chills, hemoptysis.  Also reports bilateral leg edema (L>R) but this appears to be her baseline with this pregnancy.  Was treated with Albuterol, Atrovent, Solu-Medrol, Terbutaline and Magnesium Sulphate with significant improvement.  PCCM was asked to consult.  Past Medical History  Diagnosis Date  . No pertinent past medical history   . Asthma   . H/O varicella   . H/O candidiasis   . Hepatitis B     vaccines x3  . H/O cystitis    Past Surgical History  Procedure Date  . Dilation and curettage of uterus 11/2010  . Dilation and evacuation 02/25/2011    Procedure: DILATATION AND EVACUATION (D&E);  Surgeon: Michael Litter, MD;  Location: [facility];  Service: Gynecology;  Laterality: N/A;  with chromosome studies   Prior to Admission medications   Medication Sig Start Date End Date Taking? Authorizing Provider  aspirin 81 MG tablet Take 160 mg by mouth daily.   Yes Historical Provider, MD  Prenatal Vit-Fe Fumarate-FA (PRENATAL MULTIVITAMIN) TABS Take 1 tablet by mouth daily.   Yes Historical Provider, MD   Allergies Allergies  Allergen Reactions  . Ortho Evra (Norelgestromin-Eth Estradiol) Hives   Family History Family History  Problem Relation Age of Onset  . Anesthesia problems Neg Hx   . Malignant hyperthermia Neg Hx   . Hypotension Neg Hx   . Pseudochol deficiency Neg Hx   . Asthma Mother   . Diabetes Sister   . Heart disease Maternal  Grandmother    Social History  reports that she has never smoked. She has never used smokeless tobacco. She reports that she does not drink alcohol or use illicit drugs.  Review Of Systems:  As peer HPI.  Vital Signs: Temp:  [98 F (36.7 C)-98.8 F (37.1 C)] 98 F (36.7 C) (05/30 1939) Pulse Rate:  [80-146] 93  (05/30 2106) Resp:  [14-30] 15  (05/30 2102) BP: (96-163)/(23-98) 144/64 mmHg (05/30 2104) SpO2:  [97 %-100 %] 97 % (05/30 2106) Weight:  [111.585 kg (246 lb)] 111.585 kg (246 lb) (05/30 1822)  Physical Examination: General:  Gravid female in no distress Neuro:  Awake, alert, cooperative HEENT:  Moist membranes, no icterus, pink conjunctivae Neck:  Soft Cardiovascular:  RRR, no murmurs Lungs:  Prolonged expiratory phase, few expiratory wheezes bilaterally Abdomen:  Gravid  Musculoskeletal:  Bilateral LE edema (L>R), no tenderness to palpation Skin:  Intact, no rash  Labs:   Lab 12/21/11 1922  HGB 11.5*  HCT 33.8*  WBC 10.7*  PLT 284    Lab 12/21/11 1922  NA 132*  K 3.4*  CL 100  CO2 19  GLUCOSE 130*  BUN 6  CREATININE 0.51  CALCIUM 9.1  MG --  PHOS --   Imaging:  None  ASSESSMENT AND PLAN  Mild intermittent asthma Acute asthma exacerbation Asymmetric edema lower extremities, likely pregnancy related, less likely DVT  -->  No indications for ICU transfer at this time, doing better -->  Albuterol 2.5 mg via nebulizer q4h while awake and PRN -->  Peak expiratory flows before / after bronchodilators, should continue after discharge -->  Prednisone 40 mg PO daily x 3 days, does not need to be tapered -->  Start maintenance inhaler Advair 500/50 BID, continue after discharge in addition to PRN Albuterol -->  Will follow, should continue seeing after discharge -->  Venous Doppler left lower extremity in AM  Above discussed with Dr. Su Hilt.  Lonia Farber, M.D. Pulmonary and Critical Care Medicine [facility] Pager: [phone]  12/21/2011, 9:09 PM

## 2011-12-21 NOTE — MAU Note (Signed)
Pt/spouse state asthma began at 1300 this afternoon, became progressively worse. Denies pain, denies bleeding or lof.

## 2011-12-21 NOTE — MAU Provider Note (Signed)
Late entry due to pt care  Called to bedside emergently by RN for pt who arrived in respiratory distress reporting she is having an asthma attack.  She reports using her rescue inhaler at home without improvement.    Pt appears in moderate distress, speaking in full sentences, sitting up on edge of stretcher Lung sounds present and clear bilaterally in all lobes without wheezing or rales, poor air movement with tachypnea No cyanosis present Pulse oximetry 100%  Pt stable at this time.   Respiratory paged and nebulizer treatment ordered at this time.  Attending notified of pt arrival and treatment in progress.   Care assumed by [facility], NP.  Shah, Tiffany  After initial treatment patient continued to have substernal retracting and decreased air movement; however, wheezing can now be heard. I notified anesthesia of patient status. Dr. Malen Gauze, anesthesiologist  at bedside. Patient given terbutaline 0.3 mg SQ , Solumedrol 250 mg IV and Pepcid 20 mg IV. Plan for admission.   Dr. Su Hilt in OR. Will continue care of patient until Dr. Su Hilt available.  Dr. Malen Gauze discussed patient with Dr. Su Hilt, will admit to Antenatal for further treatment.  Patient continues to improve with treatments. Care will be assumed by Dr. Su Hilt.

## 2011-12-21 NOTE — Progress Notes (Signed)
Patient ID: [name], female   DOB: Jan 18, 1981, 31 y.o.   MRN: [id number] S: pt has no complaints, states she feels much better, appears sleepy O:  Filed Vitals:   12/21/11 2204 12/21/11 2209 12/21/11 2214 12/21/11 2218  BP:      Pulse: 85 90 89 94  Temp:      TempSrc:      Resp:      Height:      Weight:      SpO2:  97% 97% 97%   BP 116/46  FHR 115 mod variability, +accels, no decels, CAT 1 toco quiet  PE deferred - pt was just examined by Dr. Marin Shutter from pulmonology   A: IUP at [redacted]w[redacted]d w exacerbation of asthma - stable  P: Receiving mag sulfate infusion per pulmonology recommendation  Also receiving prednisone  CTO closely, RT will administer nebulizer PRN Pt to have LLE dopplers in the AM Will do mag level q6h to begin at 1am D/W DR Su Hilt

## 2011-12-21 NOTE — H&P (Signed)
[name] is a 31 y.o. female, [id number] at 91 6/7 weeks presenting for admission s/p acute asthma attack.  Seen in MAU by Dr. Malen Gauze, anesthesia, for emergent treatment with Solumedrol, Terbutaline, nebulizer treatment.  Now more stable, and admitted to [facility] for further treatment and observation.  Patient reports onset of acute asthma symptoms today--has had mild issues in the past, never requiring more than infrequent inhaler use.  Denies recent URI or other illnesses.  No fever, contractions, dysuria, bleeding, or any other symptoms.  Reports +FM.  She originally presented to the office today, and was directed to MAU by Dr. Normand Sloop for emergent care.  Has sporadic hacking, non-productive cough.    Hx remarkable for: Asthma--no maintenance regimen of medication Hx recurrent SABs--on baby ASA till 28 weeks LGSIL on pap at [facility] Early 3rd trimester bleeding--normal findings.  History of present pregnancy: Patient entered care at 8 weeks.  EDC of 02/13/12 was established by LMP and in agreement with [place] at approx 7 weeks.  Anatomy scan was done at 19 weeks, with normal findings.  Further ultrasounds were done at 23 weeks, with and 28 weeks, with EFW 87%ile at that time.  Her prenatal course has been essentially uncomplicated.  . At her last evalution, she was having some swelling of feet, but no other complications.  She has an ultrasound scheduled at her next visit.  OB History    Grav Para Term Preterm Abortions TAB SAB Ect Mult Living   5 1 1  0 3 0 3 0 0 1    #1--2003 SAB at 6 weeks #08-2003.  SVB at 37 5/7 weeks. #3--5/12.  SAB at 6 weeks #4--8/12.  SAB at 6 weeks #5--Current  Past Medical History  Diagnosis Date  . No pertinent past medical history   . Asthma   . H/O varicella   . H/O candidiasis   . Hepatitis B     vaccines x3  . H/O cystitis    Past Surgical History  Procedure Date  . Dilation and curettage of uterus 11/2010  . Dilation and evacuation 02/25/2011   Procedure: DILATATION AND EVACUATION (D&E);  Surgeon: Michael Litter, MD;  Location: [facility];  Service: Gynecology;  Laterality: N/A;  with chromosome studies   Family History: family history includes Asthma in her mother; Diabetes in her sister; and Heart disease in her maternal grandmother.  There is no history of Anesthesia problems, and Malignant hyperthermia, and Hypotension, and Pseudochol deficiency, .  Social History:  reports that she has never smoked. She has never used smokeless tobacco. She reports that she does not drink alcohol or use illicit drugs.  ROS:  Non-productive cough, wheezing, SOB on arrival--now improved.    Blood pressure 139/75, pulse 91, temperature 98.8 F (37.1 C), temperature source Axillary, resp. rate 30, height 5\' 7"  (1.702 m), weight 246 lb (111.585 kg), SpO2 100.00%.  Chest--Much clearer now after 3 total nebulizer treatments, only very mild expiratory wheezes Heart--RRR without murmur, mildly tachycardic Abd--gravid, NT, FH 34 cm Pelvic--deferred Ext--DTR 2+ without clonus, tr-1+ edema  FHR reassuring on initial tracing. No UCs.  Prenatal labs: ABO, Rh:  O+ Antibody: Negative (11/29 0000) Rubella:  Immune RPR: Nonreactive (11/29 0000)  HBsAg: Negative (11/29 0000)  HIV: Non-reactive (11/29 0000)  GBS:   NA GC/chlamydia negative 3/13 Glucola 119 Declined genetic testing.   Assessment/Plan: IUP at 32 6/7 weeks Acute exacerbation of asthma--now stable  Plan: Admitted to [facility] for further treatment per consult with Dr.  Su Hilt. Initial orders for emergent treatment given by Dr. Malen Gauze, anesthesia--received Solumedrol, Terbutaline, Atrovent/Albuteral nebulizer in MAU, with subsequent additional treatment x 2 on Birthing Suite. Consulted with Dr. Sherene Sires, pulmonologist on call--recommended magnesium 2 gm bolus x 20 minutes, then 4 gm/hr x 24 hours.  He or someone from his office will see the patient during her hospitalization. Per consult with Tim,  Respiratory Therapist, will have prn order for nebulizer treatments until acute attack has broken--appears to be much better now, but will keep prn order for Albuterol/Atrovent nebulizer prn. Routine antenatal orders. CBC, diff, CMP   Lysander Calixte 12/21/2011, 6:57 PM

## 2011-12-21 NOTE — Progress Notes (Signed)
Called to MAU STAT for asthma attack.  Administered two rounds of Albuterol/Atrovent by [facility] per verbal order per Kerrie Buffalo, NP.  Breath sounds prior to tx were extremely diminished and pt was diaphoretic and extremely short of breath.  After first tx, I could hear audible wheezing and pt acknowledged that she could breathe somewhat better.  Dr. Malen Gauze in room during tx.  Will follow.

## 2011-12-21 NOTE — Telephone Encounter (Signed)
Pt presented to office at 2:45.   States about 1:00 PM had increased shortness of breath and dizziness. Had just eaten lunch.   Used inhaler but no improvement.   States has hx of asthma.  Has noticed shortness of breath when walking x 1 week.  Is noticeably short of breath at this time.   Per Dr ND,  Pt to MAU.  Pt transported to her car via wheelchair.   Pt's husband to transport pt to MAU.

## 2011-12-22 ENCOUNTER — Other Ambulatory Visit: Payer: Self-pay | Admitting: Obstetrics and Gynecology

## 2011-12-22 ENCOUNTER — Telehealth: Payer: Self-pay

## 2011-12-22 ENCOUNTER — Inpatient Hospital Stay (HOSPITAL_COMMUNITY): Payer: 59

## 2011-12-22 DIAGNOSIS — R109 Unspecified abdominal pain: Secondary | ICD-10-CM

## 2011-12-22 DIAGNOSIS — M79609 Pain in unspecified limb: Secondary | ICD-10-CM

## 2011-12-22 LAB — MAGNESIUM
Magnesium: 5.2 mg/dL — ABNORMAL HIGH (ref 1.5–2.5)
Magnesium: 5.9 mg/dL — ABNORMAL HIGH (ref 1.5–2.5)
Magnesium: 6.4 mg/dL (ref 1.5–2.5)

## 2011-12-22 MED ORDER — FLUTICASONE-SALMETEROL 500-50 MCG/DOSE IN AEPB: 1.0000 | INHALATION_SPRAY | Freq: Two times a day (BID) | RESPIRATORY_TRACT | Status: DC

## 2011-12-22 MED ORDER — PREDNISONE 20 MG PO TABS: 40.0000 mg | ORAL_TABLET | Freq: Every day | ORAL | Status: DC

## 2011-12-22 MED ORDER — ALBUTEROL SULFATE HFA 108 (90 BASE) MCG/ACT IN AERS: 2.0000 | INHALATION_SPRAY | Freq: Four times a day (QID) | RESPIRATORY_TRACT | Status: DC | PRN

## 2011-12-22 NOTE — Telephone Encounter (Signed)
Received a call from Nigel Bridgeman, not Thompson Grayer, whom is a midwife from [place] and this is her patient. Pt is being discharged today due to asthma. She states Dr. Herma Carson and Dr. Tyson Alias consulted on the pt and stated she needs to f/u in 1 week. Pt set to see MR on 01-01-12 at 2pm. Carron Curie, CMA

## 2011-12-22 NOTE — Progress Notes (Signed)
Pre tx peak flow = 220 lpm Post ts peak flow = 290 lpm

## 2011-12-22 NOTE — Telephone Encounter (Signed)
lmomtcb for [name]--pts daughter .

## 2011-12-22 NOTE — Progress Notes (Addendum)
Post treatment peak flow   :   300 lpm Best of 3 attempts

## 2011-12-22 NOTE — Progress Notes (Signed)
VASCULAR LAB PRELIMINARY  PRELIMINARY  PRELIMINARY  PRELIMINARY  Left lower extremity venous duplex  completed.    Preliminary report:  Left:  No evidence of DVT, superficial thrombosis, or Baker's cyst.   Terance Hart, RVT 12/22/2011, 11:05 AM

## 2011-12-22 NOTE — Progress Notes (Signed)
The patient was interviewed and examined. The patient is currently at [redacted] weeks gestation. Her acute asthma attack has resolved. She is doing well at this time. The patient can be discharged to home from an obstetrical standpoint. We will discuss this discharge planning with the pulmonologist.

## 2011-12-22 NOTE — Progress Notes (Signed)
Hospital day # 1 pregnancy at [redacted]w[redacted]d--s/p acute asthma attack  S:  Doing well, reports good fetal activity.  "Feels so much better"--denies SOB, chest pain, or significant cough.      Hoping for d/c today.           Seen by pulmonologist last night, with med regimen established.       On scheduled nebulizer treatments every 4 hours while awake.      Advair and Deltasone q day per pulmonologist orders      On 4 gms/hr magnesium sulfate.       Perception of contractions: none      Vaginal bleeding: None       Vaginal discharge:  None  O: BP 144/86  Pulse 113  Temp(Src) 97.5 F (36.4 C) (Oral)  Resp 18  Ht 5\' 7"  (1.702 m)  Wt 246 lb (111.585 kg)  BMI 38.53 kg/m2  SpO2 100%      Chest clear, no wheezes or adventitious sounds      Heart RRR without murmur, rate 100-113.      Fetal tracings:  Category 1      Contractions:   None      Uterus non-tender      Extremities: DTR 2+ without clonus, 1+ edema in both legs (awaiting Doppler studies)          Labs:  Mag level q 6 hours--most recent 5.9 at 7:15am       Meds: Nebulizer q 4 hours, Deltasone po q day, Advair q day  A: [redacted]w[redacted]d with acute asthma attack--now stable  P: Continue current plan of care      Upcoming tests/treatments:  Doppler studies for DVT eval.      MDs will follow, and determine plan of care regarding D/C.  Nigel Bridgeman CNM, [place] 12/22/2011 10:17 AM

## 2011-12-22 NOTE — Discharge Summary (Signed)
Physician Discharge Summary  Patient ID: [name] MRN: [id number] DOB/AGE: 20-Oct-1980 31 y.o.  Admit date: 12/21/2011 Discharge date: 12/22/2011  Admission Diagnoses:  IUP at 32 6/7 weeks, acute asthma flare  Discharge Diagnoses:  Active Problems:  Mild intermittent asthma  Acute asthma exacerbation  Pedal edema  Pregnancy   Discharged Condition: stable  Hospital Course: Presented to [facility] with acute asthma attack at 32 6/7 weeks.  Seen by Dr. Malen Gauze, anesthesia, and treated with Solumedrol, Terbutaline, Nebulizer series until cycle broken (4 treatments). Placed on Magnesium sulfate 2 gm bolus x 20 min, then 4 gm/hour overnight.  Seen by Dr. Marin Shutter the evening of admission, and Dr. Quintin Alto on 12/22/11.  Patient improved quickly after nebulizer series, and these were continued q 4 hours while awake.  Labs were WNL.  CXR negative on 5/31.  Patient was discharged home on 5/31 with plan for Advair daily, Prednisone 40 mg po q day x 3 days, Albuterol inhaler prn.  To use peak flow meter before and after use of bronchodilators.  Follow-up at [facility] 6/10 and with [facility] 12/28/11.  Consults: pulmonary/intensive care (Dr. Quintin Alto and Dr. Marin Shutter)  Significant Diagnostic Studies: labs: CBC, diff, CMP and radiology: CXR: normal  Treatments: Steroids: solu-medrol, prednisone; Respiratory therapy: albuterol/atropine nebulizer   Discharge Exam: Blood pressure 133/62, pulse 102, temperature 98.2 F (36.8 C), temperature source Oral, resp. rate 18, height 5\' 7"  (1.702 m), weight 246 lb (111.585 kg), SpO2 100.00%. O2 sat stable with ambulation.  Patient tolerated the ambulation without syncope or wheezing. General appearance: alert Resp: clear to auscultation bilaterally Chest wall: no tenderness GI: Gravid, NT Extremities: edema 1+ and Homans sign is negative, no sign of DVT Dopplers for DVT negative  Disposition: 01-Home or Self Care  Discharge Orders    Future  Appointments: Provider: Department: Dept Phone: Center:   12/28/2011 4:00 PM [facility] [phone] None   12/28/2011 4:30 PM Esmeralda Arthur, MD [facility] [phone] None     Future Orders Please Complete By Expires   OB RESULTS CONSOLE Rubella Antibody      Comments:   This external order was created through the Results Console.     Medication List  As of 12/22/2011  3:37 PM   TAKE these medications         albuterol 108 (90 BASE) MCG/ACT inhaler   Commonly known as: PROVENTIL HFA;VENTOLIN HFA   Inhale 2 puffs into the lungs every 6 (six) hours as needed for wheezing.      aspirin 81 MG tablet   Take 160 mg by mouth daily.      Fluticasone-Salmeterol 500-50 MCG/DOSE Aepb   Commonly known as: ADVAIR   Inhale 1 puff into the lungs 2 (two) times daily.      predniSONE 20 MG tablet   Commonly known as: DELTASONE   Take 2 tablets (40 mg total) by mouth daily with breakfast.      prenatal multivitamin Tabs   Take 1 tablet by mouth daily.          Prednisone 40 mg po q day x 3 days.  Follow-up with [facility] at 2pm on 01/01/12 (1st available)   Signed: Nigel Bridgeman 12/22/2011, 3:37 PM

## 2011-12-22 NOTE — Progress Notes (Signed)
Pt ambulated for pulse ox stayed WNL

## 2011-12-22 NOTE — Progress Notes (Signed)
Peak flow post tx = 360 lpm

## 2011-12-22 NOTE — Discharge Instructions (Signed)
Asthma, Adult Asthma is caused by narrowing of the air passages in the lungs. It may be triggered by pollen, dust, animal dander, molds, some foods, respiratory infections, exposure to smoke, exercise, emotional stress or other allergens (things that cause allergic reactions or allergies). Repeat attacks are common. HOME CARE INSTRUCTIONS   Use prescription medications as ordered by your caregiver.   Avoid pollen, dust, animal dander, molds, smoke and other things that cause attacks at home and at work.   You may have fewer attacks if you decrease dust in your home. Electrostatic air cleaners may help.   It may help to replace your pillows or mattress with materials less likely to cause allergies.   Talk to your caregiver about an action plan for managing asthma attacks at home, including, the use of a peak flow meter which measures the severity of your asthma attack. An action plan can help minimize or stop the attack without having to seek medical care.   If you are not on a fluid restriction, drink 8 to 10 glasses of water each day.   Always have a plan prepared for seeking medical attention, including, calling your physician, accessing local emergency care, and calling 911 (in the [place] for a severe attack.   Discuss possible exercise routines with your caregiver.   If animal dander is the cause of asthma, you may need to get rid of pets.  SEEK MEDICAL CARE IF:   You have wheezing and shortness of breath even if taking medicine to prevent attacks.   You have muscle aches, chest pain or thickening of sputum.   Your sputum changes from clear or white to yellow, green, gray, or bloody.   You have any problems that may be related to the medicine you are taking (such as a rash, itching, swelling or trouble breathing).  SEEK IMMEDIATE MEDICAL CARE IF:   Your usual medicines do not stop your wheezing or there is increased coughing and/or shortness of breath.   You have increased  difficulty breathing.   You have a fever.  MAKE SURE YOU:   Understand these instructions.   Will watch your condition.   Will get help right away if you are not doing well or get worse.  Document Released: 07/10/2005 Document Revised: 06/29/2011 Document Reviewed: 02/26/2008 [facility] Patient Information 2012 [place], [place].   Additional instructions: Use peak flow meter before and after each use of bronchodilators (Advair, Albuterol inhaler). Call for any worsening of symptoms.  For acute episode, go to [employer] or [employer].   Keep scheduled appointments at [facility] 6/10 at 2 pm and at [facility] on 6/6.

## 2011-12-22 NOTE — Consult Note (Signed)
Name: [name] MRN: [id number] DOB: 1981/03/13    LOS: 1  Referring Provider:  Su Hilt Reason for Referral:  Asthma exacerbation  PULMONARY / CRITICAL CARE MEDICINE  HPI:  31 yo [redacted] weeks pregnant with mild intermittent asthma (takes Albuterol PRN, last several weeks ago), never intubated, admitted to [facility] with acute asthma attack.  Reports acute onset of chest tightness, wheezing and dyspnea this afternoon.  She does report feeling hot and presyncopal, but that resolved quickly. There were no specific triggers. There was no fever, chills, hemoptysis.  Also reports bilateral leg edema (L>R) but this appears to be her baseline with this pregnancy.  Was treated with Albuterol, Atrovent, Solu-Medrol, Terbutaline and Magnesium Sulphate with significant improvement.  PCCM was asked to consult.  5/31- no distress  Vital Signs: Temp:  [97.5 F (36.4 C)-98.8 F (37.1 C)] 98.2 F (36.8 C) (05/31 1152) Pulse Rate:  [71-146] 111  (05/31 1325) Resp:  [14-30] 18  (05/31 1302) BP: (96-163)/(23-98) 133/62 mmHg (05/31 1152) SpO2:  [92 %-100 %] 100 % (05/31 1325) Weight:  [111.585 kg (246 lb)] 111.585 kg (246 lb) (05/30 1822)  Physical Examination: General:  Gravid female in no distress Neuro:  Awake, alert, cooperative HEENT:  jvd wnl Neck:  No upper airway sounds Cardiovascular:  RRR, no murmurs Lungs:  Prolonged expiratory phase resolved, good air entry , slight reduced bases Abdomen:  Gravid  Musculoskeletal:  Bilateral LE edema (L>R), no tenderness to palpation Skin:  Intact, no rash  Labs:   Lab 12/21/11 1922  HGB 11.5*  HCT 33.8*  WBC 10.7*  PLT 284    Lab 12/22/11 0715 12/22/11 0115 12/21/11 1922  NA -- -- 132*  K -- -- 3.4*  CL -- -- 100  CO2 -- -- 19  GLUCOSE -- -- 130*  BUN -- -- 6  CREATININE -- -- 0.51  CALCIUM -- -- 9.1  MG 5.9* 5.2* --  PHOS -- -- --   Imaging:  None  ASSESSMENT AND PLAN  Mild intermittent asthma Acute asthma exacerbation Asymmetric  edema lower extremities, likely pregnancy related, DVT neg She feels she is at baseline again  -->  Albuterol 2.5 mg via nebulizer q4h while awake and PRN while in hospital Upon dc, then to prn again, she is fully aware of how to use and when to call MD. She does need a nEW RX for inhlaer -->  Peak expiratory flows before / after bronchodilators, should continue after discharge, reviewed -->  Prednisone 40 mg PO daily x 3 days, does not need to be tapered then dc --> follow up pulmonary med leb in 1 week, Dr Gwenevere Abbot if available [phone] -->  maintenance inhaler Advair 500/50 BID, continue after discharge in addition to PRN Albuterol -->  Venous Doppler left lower extremity reviewed prelim - neg -->consider dc magnesium --> low threshold pcxr, avoid if able -->ambulate and check pulse oximetry, prior to dc  Nelda Bucks., M.D. Pulmonary and Critical Care Medicine [facility] Pager: [phone]  12/22/2011, 1:30 PM

## 2011-12-25 ENCOUNTER — Telehealth: Payer: Self-pay | Admitting: Obstetrics and Gynecology

## 2011-12-25 NOTE — Telephone Encounter (Signed)
[facility] CALLED NEEDING CLARITY OF QTY OF MEDS, CONSULTED W/ VL, CORRECTED QTY GIVEN.

## 2011-12-25 NOTE — Telephone Encounter (Signed)
DENTAL LETTER FAXED TO PT'S DENTIST FOR APPT TOMORROW

## 2011-12-25 NOTE — Telephone Encounter (Signed)
Triage/epic 

## 2011-12-28 ENCOUNTER — Encounter: Payer: Self-pay | Admitting: Obstetrics and Gynecology

## 2011-12-28 ENCOUNTER — Ambulatory Visit (INDEPENDENT_AMBULATORY_CARE_PROVIDER_SITE_OTHER): Payer: 59 | Admitting: Obstetrics and Gynecology

## 2011-12-28 ENCOUNTER — Ambulatory Visit (INDEPENDENT_AMBULATORY_CARE_PROVIDER_SITE_OTHER): Payer: 59

## 2011-12-28 ENCOUNTER — Other Ambulatory Visit: Payer: Self-pay | Admitting: Obstetrics and Gynecology

## 2011-12-28 VITALS — BP 126/74 | Wt 246.0 lb

## 2011-12-28 DIAGNOSIS — Z331 Pregnant state, incidental: Secondary | ICD-10-CM

## 2011-12-28 DIAGNOSIS — O99891 Other specified diseases and conditions complicating pregnancy: Secondary | ICD-10-CM

## 2011-12-28 DIAGNOSIS — J45909 Unspecified asthma, uncomplicated: Secondary | ICD-10-CM

## 2011-12-28 DIAGNOSIS — O99519 Diseases of the respiratory system complicating pregnancy, unspecified trimester: Secondary | ICD-10-CM

## 2011-12-28 NOTE — Progress Notes (Signed)
Just discharged from [facility] for worsening asthma Followed closely by pulmologist. Completed steroids. Now using Advair BID and Albuterol every 6 hours. Lungs: clear, no wheezing, decreased breath sounds diffusely OOW until delivery

## 2011-12-28 NOTE — Progress Notes (Signed)
Pt has a lot of pressure JM

## 2011-12-28 NOTE — Progress Notes (Signed)
Sono:  EFW:  5lb 10oz    75.9 %  AFI:  16.64 cm  Normal @ 60th %tle  Cx length:  3.24   Vertex presentation, anterior placenta.

## 2011-12-29 ENCOUNTER — Telehealth: Payer: Self-pay | Admitting: Obstetrics and Gynecology

## 2011-12-29 LAB — US OB FOLLOW UP

## 2011-12-29 NOTE — Telephone Encounter (Signed)
Triage/cht received 

## 2011-12-29 NOTE — Telephone Encounter (Signed)
Spoke with pt rgd msg pt states short term disability need more info to file claim advised pt will inform disability coordinator and have her contact her disability agent pt voice understanding

## 2012-01-01 ENCOUNTER — Encounter: Payer: Self-pay | Admitting: Internal Medicine

## 2012-01-01 ENCOUNTER — Ambulatory Visit (INDEPENDENT_AMBULATORY_CARE_PROVIDER_SITE_OTHER): Payer: 59 | Admitting: Internal Medicine

## 2012-01-01 VITALS — BP 126/62 | HR 116 | Temp 98.0°F | Ht 67.5 in | Wt 247.0 lb

## 2012-01-01 DIAGNOSIS — R0989 Other specified symptoms and signs involving the circulatory and respiratory systems: Secondary | ICD-10-CM

## 2012-01-01 DIAGNOSIS — R0609 Other forms of dyspnea: Secondary | ICD-10-CM

## 2012-01-01 DIAGNOSIS — R0600 Dyspnea, unspecified: Secondary | ICD-10-CM

## 2012-01-01 NOTE — Progress Notes (Signed)
Subjective:    Patient ID: [name], female    DOB: 07-31-1980, 31 y.o.   MRN: [id number]  HPI  PCP is SPIEGEL,RACHEL, MD, MD . Body mass index is 38.11 kg/(m^2).  reports that she has never smoked. She has never used smokeless tobacco. [redacted] weeks pregnant. OB is Dr Samule Ohm Dillard   IOV 01/01/2012  She was admitted 12/21/11 for acute asthma and discharged on new advair 500/50 and pred taper on 12/22/11. She is noew here for followup. SHe has finished her pred taper now. She tells me that she had asthma as a child but then outgrew. Never had symptoms even during first pregnancy 7 years ago. She has been completely asymptomatic even between first and 2nd pregnancy. Even during 2nd pregnancy became symptomatic with dyspnea only few weeks ago. Dyspnea is associated with some cough and wheeze but also some pedal edema. Only aggravating factor is exertion and even minimal exertion makes her dyspneic. Dyspnea relieved by rest and albuterol byut relief from albuterol is very transient. Dyspnea rated as severe-moderate. Denies chest pain or any radiation. Symptoms have been progressive. She is unsure if advair or prednisone helped her.   Past, Family, Social reviewed: no change since last visit       Review of Systems  Constitutional: Negative for fever and unexpected weight change.  HENT: Negative for ear pain, nosebleeds, congestion, sore throat, rhinorrhea, sneezing, trouble swallowing, dental problem, postnasal drip and sinus pressure.   Eyes: Negative for redness and itching.  Respiratory: Positive for cough and shortness of breath. Negative for chest tightness and wheezing.   Cardiovascular: Positive for leg swelling. Negative for palpitations.  Gastrointestinal: Negative for nausea and vomiting.  Genitourinary: Negative for dysuria.  Musculoskeletal: Negative for joint swelling.  Skin: Negative for rash.  Neurological: Negative for headaches.  Hematological: Does not bruise/bleed  easily.  Psychiatric/Behavioral: Negative for dysphoric mood. The patient is not nervous/anxious.        Objective:   Physical Exam  Vitals reviewed. Constitutional: She is oriented to person, place, and time. She appears well-developed and well-nourished. No distress.       Body mass index is 38.11 kg/(m^2).   HENT:  Head: Normocephalic and atraumatic.  Right Ear: External ear normal.  Left Ear: External ear normal.  Mouth/Throat: Oropharynx is clear and moist. No oropharyngeal exudate.  Eyes: Conjunctivae and EOM are normal. Pupils are equal, round, and reactive to light. Right eye exhibits no discharge. Left eye exhibits no discharge. No scleral icterus.  Neck: Normal range of motion. Neck supple. No JVD present. No tracheal deviation present. No thyromegaly present.       ? Increased JVP  Cardiovascular: Normal rate, regular rhythm, normal heart sounds and intact distal pulses.  Exam reveals no gallop and no friction rub.   No murmur heard. Pulmonary/Chest: Effort normal and breath sounds normal. No respiratory distress. She has no wheezes. She has no rales. She exhibits no tenderness.       Seemed to get dyspneic on direct obersvation and less when not observed  Abdominal: Soft. Bowel sounds are normal. She exhibits distension. She exhibits no mass. There is no tenderness. There is no rebound and no guarding.       Pregnant +  Musculoskeletal: Normal range of motion. She exhibits edema. She exhibits no tenderness.       Trace pedal edema  Lymphadenopathy:    She has no cervical adenopathy.  Neurological: She is alert and oriented to person, place, and  time. She has normal reflexes. No cranial nerve deficit. She exhibits normal muscle tone. Coordination normal.  Skin: Skin is warm and dry. No rash noted. She is not diaphoretic. No erythema. No pallor.  Psychiatric: Judgment and thought content normal.       Mildly anxious          Assessment & Plan:

## 2012-01-01 NOTE — Patient Instructions (Signed)
Please have echocardiogram of heart asap next 24h I will call you with results I will be in touch with Dr Aram Beecham dillard Followup based on echo results

## 2012-01-01 NOTE — Assessment & Plan Note (Signed)
Get echo to rule out peri partum cardiomyopathy  - wil do it <24-48h If this is negative, need to assess if truly asthma, or pregnancy related or anxiety

## 2012-01-02 ENCOUNTER — Encounter: Payer: Self-pay | Admitting: Obstetrics and Gynecology

## 2012-01-02 ENCOUNTER — Ambulatory Visit (INDEPENDENT_AMBULATORY_CARE_PROVIDER_SITE_OTHER): Payer: 59 | Admitting: Obstetrics and Gynecology

## 2012-01-02 VITALS — BP 112/74 | Wt 246.0 lb

## 2012-01-02 DIAGNOSIS — J45909 Unspecified asthma, uncomplicated: Secondary | ICD-10-CM

## 2012-01-02 DIAGNOSIS — O99891 Other specified diseases and conditions complicating pregnancy: Secondary | ICD-10-CM

## 2012-01-02 NOTE — Progress Notes (Signed)
Pt states he has SOB with exertion. No CP CV RRR LUNGS  CTAB Asthma.  Pt seen by Dr Elly Modena.  He wants to check an echo to r/o cardiomyopathy.  It is scheduled for tomorrow Pt SOB with lying down.  I offered her observation in the hospital.  She declined.   Will check echo tomorrow S>D [place] for growth at [place]

## 2012-01-03 ENCOUNTER — Ambulatory Visit (HOSPITAL_COMMUNITY): Payer: 59 | Attending: Internal Medicine | Admitting: Radiology

## 2012-01-03 ENCOUNTER — Telehealth: Payer: Self-pay | Admitting: Internal Medicine

## 2012-01-03 DIAGNOSIS — O99891 Other specified diseases and conditions complicating pregnancy: Secondary | ICD-10-CM | POA: Insufficient documentation

## 2012-01-03 DIAGNOSIS — J45909 Unspecified asthma, uncomplicated: Secondary | ICD-10-CM | POA: Insufficient documentation

## 2012-01-03 DIAGNOSIS — R609 Edema, unspecified: Secondary | ICD-10-CM | POA: Insufficient documentation

## 2012-01-03 DIAGNOSIS — R0609 Other forms of dyspnea: Secondary | ICD-10-CM | POA: Insufficient documentation

## 2012-01-03 DIAGNOSIS — R0989 Other specified symptoms and signs involving the circulatory and respiratory systems: Secondary | ICD-10-CM | POA: Insufficient documentation

## 2012-01-03 DIAGNOSIS — R0600 Dyspnea, unspecified: Secondary | ICD-10-CM

## 2012-01-03 DIAGNOSIS — R0602 Shortness of breath: Secondary | ICD-10-CM

## 2012-01-03 DIAGNOSIS — O2230 Deep phlebothrombosis in pregnancy, unspecified trimester: Secondary | ICD-10-CM

## 2012-01-03 NOTE — Progress Notes (Signed)
Echocardiogram performed.  

## 2012-01-03 NOTE — Telephone Encounter (Signed)
[name]  Please ensure doppler legs for 6/13  Thanks MR  Below is for ME  Doppler 12/22/11 - negative for DVT  Echo is normal today. So no evidence peri partum cardiomyopathy  I d/w Dr Pennie Rushing - she reviewed chart and patient has hx of many miscarriages but clotting factors cardiolipin, and others alll checked all negative. So, per her risk of PE is just the risk from pregnancy and not other increased risk  We discussed CT angio: Dr Pennie Rushing feels based on her conversations with Dr Normand Sloop that dyspnea impressive and due to normal echo it is pulmnary call.   I still think that though dyspnea is unexplained due to doppler leg x 1 negative and normal ECHO risk for PE is low.   I conveyed all above to patient: she wants to hold off on cT due to radiation risk for kid.  So, will get duplex legs again (serial). If positive, then Rx for PE. If negative, need to reconsider CT angio again.    She is in agreement with plan

## 2012-01-04 NOTE — Telephone Encounter (Signed)
i thought I clicked bilateral in the order; should be bilateral

## 2012-01-04 NOTE — Telephone Encounter (Signed)
Order has been placed for bilateral doplar of legs.

## 2012-01-04 NOTE — Telephone Encounter (Signed)
Please clarify if you want the doppler on both legs or just one? The one she had n the past was for left leg. The order placed did not specify. Please advise. Carron Curie, CMA

## 2012-01-04 NOTE — Telephone Encounter (Signed)
Rhonda had doppler scheduled for today at 1:30pm. She called the pt and she stated she could not go today. I then spoke with the pt and she states she has company coming over today and they are to arrive around 1:30pm and she cannot go to appt today. She states when she spoke to MR yesterday she was not awaer that this was going to be scheduled today and since it is a late notice she cannot tell her company to not come.  I advised the pt of the importance of getting the doppler as soon as possible, but she again states she cannot go today. [facility] is aware and will r/s to tomorrow. I will forward message to MR so he is aware. Carron Curie, CMA

## 2012-01-04 NOTE — Telephone Encounter (Signed)
I told her the importance of PE and told her next 24h likely will get it done and should be done on 6/13. She said okay. Fine she is taking the risk. Do it for 6/14 then.

## 2012-01-05 ENCOUNTER — Encounter (INDEPENDENT_AMBULATORY_CARE_PROVIDER_SITE_OTHER): Payer: 59

## 2012-01-05 DIAGNOSIS — O2230 Deep phlebothrombosis in pregnancy, unspecified trimester: Secondary | ICD-10-CM

## 2012-01-05 DIAGNOSIS — R609 Edema, unspecified: Secondary | ICD-10-CM

## 2012-01-05 DIAGNOSIS — R0602 Shortness of breath: Secondary | ICD-10-CM

## 2012-01-08 ENCOUNTER — Telehealth: Payer: Self-pay | Admitting: Obstetrics and Gynecology

## 2012-01-08 NOTE — Telephone Encounter (Signed)
Spoke with pt rgd msg pt c/o pelvic pain and rectal pressure no spotting no bleeding no leaking fluid positive fetal movement no contractions haven't taken anything for discomfort advised pt pressure coming from baby making its way threw birth canal normal to have pressure twords the end of preg advised pt can take tylenol every 6 hrs increase water try to switch postition when resting if no relief call office for eval pt voice understanding

## 2012-01-10 ENCOUNTER — Telehealth: Payer: Self-pay | Admitting: Internal Medicine

## 2012-01-10 NOTE — Telephone Encounter (Signed)
LMTCB

## 2012-01-10 NOTE — Telephone Encounter (Signed)
Triage (in absence of [place]),    - results given. Please give fu to see me (can go upto 12/day) or NP Tammy < 10 days for dyspnea  Thanks  MR  Below for reference    Repeat doppler negative. I highly doubt PE  Ddx for dyspnea is anxiety(she denies), pregnancy and asthma.   She says she feels better because of increased albuterol use. I think best she comes in and we change her advair to pulmicort ICS (pregnancy preferred) +/- another./same LABA +/- consider another pred burst  Needs to be seen< 1 week by me or my NP Tammy

## 2012-01-11 ENCOUNTER — Encounter: Payer: Self-pay | Admitting: Obstetrics and Gynecology

## 2012-01-11 ENCOUNTER — Ambulatory Visit (INDEPENDENT_AMBULATORY_CARE_PROVIDER_SITE_OTHER): Payer: 59

## 2012-01-11 ENCOUNTER — Telehealth: Payer: Self-pay | Admitting: Internal Medicine

## 2012-01-11 ENCOUNTER — Ambulatory Visit (INDEPENDENT_AMBULATORY_CARE_PROVIDER_SITE_OTHER): Payer: 59 | Admitting: Obstetrics and Gynecology

## 2012-01-11 ENCOUNTER — Other Ambulatory Visit: Payer: Self-pay | Admitting: Obstetrics and Gynecology

## 2012-01-11 VITALS — BP 122/60 | Wt 250.0 lb

## 2012-01-11 DIAGNOSIS — O99519 Diseases of the respiratory system complicating pregnancy, unspecified trimester: Secondary | ICD-10-CM

## 2012-01-11 DIAGNOSIS — J45909 Unspecified asthma, uncomplicated: Secondary | ICD-10-CM

## 2012-01-11 DIAGNOSIS — Z331 Pregnant state, incidental: Secondary | ICD-10-CM

## 2012-01-11 DIAGNOSIS — O99891 Other specified diseases and conditions complicating pregnancy: Secondary | ICD-10-CM

## 2012-01-11 MED ORDER — HYDROCORTISONE ACE-PRAMOXINE 1-1 % RE FOAM: 1.0000 | Freq: Two times a day (BID) | RECTAL | Status: AC

## 2012-01-11 NOTE — Progress Notes (Signed)
Return for repeat USS: size greater than dates : today 85th percentile, Vx presentation,AFI 60th percentile Cx 3.4cms, closed Under treatment with pulmonologist for exacerbation of asthma - patient for follow up with same for alternation of medication GBS performed, F/u 1 weeks, Hemoroids not improved with OTC preps for prescription: Protocol Foam HC Attended waterbirth class VL not sure if this suitable due to respiratory status, will eval over next 1-2 weeks

## 2012-01-11 NOTE — Telephone Encounter (Signed)
Spoke with pt and scheduled ov with TP for 01-16-12 at 11:15 am

## 2012-01-11 NOTE — Telephone Encounter (Signed)
Duplicate msg.

## 2012-01-11 NOTE — Progress Notes (Signed)
C/o increased rectal pressure no relief with Prep H nor Tucks reqs something different  C/o increase pelvic pressure difficulty walking

## 2012-01-13 LAB — STREP B DNA PROBE: GBSP: POSITIVE

## 2012-01-14 ENCOUNTER — Encounter: Payer: Self-pay | Admitting: Obstetrics and Gynecology

## 2012-01-14 DIAGNOSIS — O99820 Streptococcus B carrier state complicating pregnancy: Secondary | ICD-10-CM | POA: Insufficient documentation

## 2012-01-16 ENCOUNTER — Encounter: Payer: Self-pay | Admitting: Adult Health

## 2012-01-16 ENCOUNTER — Encounter: Payer: Self-pay | Admitting: Obstetrics and Gynecology

## 2012-01-16 ENCOUNTER — Ambulatory Visit (INDEPENDENT_AMBULATORY_CARE_PROVIDER_SITE_OTHER): Payer: 59 | Admitting: Obstetrics and Gynecology

## 2012-01-16 ENCOUNTER — Ambulatory Visit (INDEPENDENT_AMBULATORY_CARE_PROVIDER_SITE_OTHER): Payer: 59 | Admitting: Adult Health

## 2012-01-16 VITALS — BP 116/64 | HR 74 | Temp 97.1°F | Ht 67.0 in | Wt 253.0 lb

## 2012-01-16 VITALS — BP 122/50 | Ht 67.0 in | Wt 253.0 lb

## 2012-01-16 DIAGNOSIS — Z331 Pregnant state, incidental: Secondary | ICD-10-CM

## 2012-01-16 DIAGNOSIS — R0600 Dyspnea, unspecified: Secondary | ICD-10-CM

## 2012-01-16 DIAGNOSIS — Z3490 Encounter for supervision of normal pregnancy, unspecified, unspecified trimester: Secondary | ICD-10-CM

## 2012-01-16 DIAGNOSIS — J4520 Mild intermittent asthma, uncomplicated: Secondary | ICD-10-CM

## 2012-01-16 DIAGNOSIS — O99820 Streptococcus B carrier state complicating pregnancy: Secondary | ICD-10-CM

## 2012-01-16 DIAGNOSIS — J45901 Unspecified asthma with (acute) exacerbation: Secondary | ICD-10-CM

## 2012-01-16 DIAGNOSIS — O09899 Supervision of other high risk pregnancies, unspecified trimester: Secondary | ICD-10-CM

## 2012-01-16 DIAGNOSIS — Z22330 Carrier of Group B streptococcus: Secondary | ICD-10-CM

## 2012-01-16 DIAGNOSIS — J45909 Unspecified asthma, uncomplicated: Secondary | ICD-10-CM

## 2012-01-16 DIAGNOSIS — R0609 Other forms of dyspnea: Secondary | ICD-10-CM

## 2012-01-16 NOTE — Assessment & Plan Note (Signed)
Asthma -uncontrolled during pregnancy  Improved w/ current regimen but using SABA 3 times daily .  Advised to continue on current regimen.  Will discuss with Dr. Marchelle Gearing regarding changing to Pulmicort regimen.  follow up prior to delivery in 2-3 weeks and As needed

## 2012-01-16 NOTE — Progress Notes (Signed)
Subjective:    Patient ID: [name], female    DOB: 1981-02-16, 31 y.o.   MRN: [id number]  HPI  PCP is SPIEGEL,RACHEL, MD,   31 yo AAF , never smoker. Seen for asthma management during pregnancy .   OB is Dr Samule Ohm Dillard   IOV 01/01/2012  She was admitted 12/21/11 for acute asthma and discharged on new advair 500/50 and pred taper on 12/22/11. She is noew here for followup. SHe has finished her pred taper now. She tells me that she had asthma as a child but then outgrew. Never had symptoms even during first pregnancy 7 years ago. She has been completely asymptomatic even between first and 2nd pregnancy. Even during 2nd pregnancy became symptomatic with dyspnea only few weeks ago. Dyspnea is associated with some cough and wheeze but also some pedal edema. Only aggravating factor is exertion and even minimal exertion makes her dyspneic. Dyspnea relieved by rest and albuterol byut relief from albuterol is very transient. Dyspnea rated as severe-moderate. Denies chest pain or any radiation. Symptoms have been progressive. She is unsure if advair or prednisone helped her.  >2 D Echo Mild LVH, nml EF  Ven dopplers neg for DVT   Continued on Advair in 3rd trimester.   01/16/2012 Follow up  Returns for  week follow up - reports dyspnea is improved with use of albuterol = 3-4 times daily Using Advair 500 Twice daily   Still gets winded easily. Some wheezing on/off.  Overall improved.  No increased edema . No hemoptysis .  Last ov echo showed Mild LVH, w/ nml EF, neg ven dopplers.  EDD ~7/20, considering water birth but unsure she will be able to do due to asthma issues.      Review of Systems  Constitutional:   No  weight loss, night sweats,  Fevers, chills,  +fatigue, or  lassitude.  HEENT:   No headaches,  Difficulty swallowing,  Tooth/dental problems, or  Sore throat,                No sneezing, itching, ear ache, nasal congestion, post nasal drip,   CV:  No chest pain,   Orthopnea, PND, swelling in lower extremities, anasarca, dizziness, palpitations, syncope.   GI  No heartburn, indigestion, abdominal pain, nausea, vomiting, diarrhea, change in bowel habits, loss of appetite, bloody stools.   Resp:  No coughing up of blood.   No chest wall deformity  Skin: no rash or lesions.  GU: no dysuria, change in color of urine, no urgency or frequency.  No flank pain, no hematuria   MS:  No joint pain or swelling.  No decreased range of motion.  No back pain.  Psych:  No change in mood or affect. No depression or anxiety.  No memory loss.         Objective:   Physical Exam  GEN: A/Ox3; pleasant , NAD, c/w pregnancy   HEENT:  [place]/AT,  EACs-clear, TMs-wnl, NOSE-clear, THROAT-clear, no lesions, no postnasal drip or exudate noted.   NECK:  Supple w/ fair ROM; no JVD; normal carotid impulses w/o bruits; no thyromegaly or nodules palpated; no lymphadenopathy.  RESP  Clear  P & A; w/o, wheezes/ rales/ or rhonchi.no accessory muscle use, no dullness to percussion  CARD:  RRR, no m/r/g  , no peripheral edema, pulses intact, no cyanosis or clubbing.  GI:   Soft & nt; nml bowel sounds; no organomegaly or masses detected.-large protuberant abd c/w preg.   Musco: Warm  bil, no deformities or joint swelling noted.   Neuro: alert, no focal deficits noted.    Skin: Warm, no lesions or rashes

## 2012-01-16 NOTE — Progress Notes (Signed)
Pt voices no complaints today. Pt desires cx check.  1)Was seen today at [facility] pulmonology by Dr. Jane Canary assistant with no change in her regimen. She will follow up there in 2 weeks. Water birth discussed but not recommended in case respiratory support depends on having excellent pain relief and epidural is required for that. 2)GBS POSITIVE 3) Concerned re s>D.  Last baby weighed 5+ lbs. Recommend [place] for EFW @39  wks

## 2012-01-16 NOTE — Patient Instructions (Addendum)
Continue on current regimen.  follow up Dr. Marchelle Gearing in 3 weeks prior to delivery date.  Please contact office for sooner follow up if symptoms do not improve or worsen or seek emergency care

## 2012-01-17 ENCOUNTER — Other Ambulatory Visit: Payer: Self-pay

## 2012-01-17 DIAGNOSIS — J45909 Unspecified asthma, uncomplicated: Secondary | ICD-10-CM

## 2012-01-19 ENCOUNTER — Telehealth: Payer: Self-pay | Admitting: Obstetrics and Gynecology

## 2012-01-19 NOTE — Telephone Encounter (Signed)
See telephone note.

## 2012-01-19 NOTE — Telephone Encounter (Signed)
rcv'd TC from pt regarding IOL appointment. Pt states she would rather come Sunday AM, (she is sched now for 730pm on 6-29), instructed pt ok to come at 5am on 6-30.

## 2012-01-20 ENCOUNTER — Inpatient Hospital Stay (HOSPITAL_COMMUNITY)
Admission: RE | Admit: 2012-01-20 | Discharge: 2012-01-24 | DRG: 775 | Disposition: A | Discharge status: Home / Self Care | Payer: 59 | Source: Ambulatory Visit | Attending: Obstetrics and Gynecology | Admitting: Obstetrics and Gynecology

## 2012-01-20 ENCOUNTER — Encounter (HOSPITAL_COMMUNITY): Payer: Self-pay

## 2012-01-20 VITALS — BP 126/78 | HR 98 | Temp 98.5°F | Resp 18 | Ht 67.0 in | Wt 253.0 lb

## 2012-01-20 DIAGNOSIS — J45909 Unspecified asthma, uncomplicated: Secondary | ICD-10-CM | POA: Diagnosis present

## 2012-01-20 DIAGNOSIS — O9903 Anemia complicating the puerperium: Secondary | ICD-10-CM | POA: Diagnosis not present

## 2012-01-20 DIAGNOSIS — O99892 Other specified diseases and conditions complicating childbirth: Principal | ICD-10-CM | POA: Diagnosis present

## 2012-01-20 DIAGNOSIS — D649 Anemia, unspecified: Secondary | ICD-10-CM | POA: Diagnosis not present

## 2012-01-20 DIAGNOSIS — O99820 Streptococcus B carrier state complicating pregnancy: Secondary | ICD-10-CM

## 2012-01-20 DIAGNOSIS — Z22330 Carrier of Group B streptococcus: Secondary | ICD-10-CM

## 2012-01-21 ENCOUNTER — Encounter (HOSPITAL_COMMUNITY): Payer: Self-pay

## 2012-01-21 LAB — TYPE AND SCREEN
ABO/RH(D): O POS
Antibody Screen: NEGATIVE

## 2012-01-21 LAB — CBC
MCH: 30.1 pg (ref 26.0–34.0)
MCV: 89.3 fL (ref 78.0–100.0)
Platelets: 303 10*3/uL (ref 150–400)
RBC: 3.82 MIL/uL — ABNORMAL LOW (ref 3.87–5.11)
RDW: 14.6 % (ref 11.5–15.5)

## 2012-01-21 LAB — RPR: RPR Ser Ql: NONREACTIVE

## 2012-01-21 MED ORDER — ALBUTEROL SULFATE HFA 108 (90 BASE) MCG/ACT IN AERS
1.0000 | INHALATION_SPRAY | RESPIRATORY_TRACT | Status: DC | PRN
  Administered 2012-01-21: 2 via RESPIRATORY_TRACT
  Filled 2012-01-21: qty 6.7

## 2012-01-21 MED ORDER — PRAMOXINE HCL 1 % RE FOAM: Freq: Three times a day (TID) | RECTAL | Status: DC | PRN

## 2012-01-21 MED ORDER — OXYCODONE-ACETAMINOPHEN 5-325 MG PO TABS: 1.0000 | ORAL_TABLET | ORAL | Status: DC | PRN

## 2012-01-21 MED ORDER — IBUPROFEN 600 MG PO TABS: 600.0000 mg | ORAL_TABLET | Freq: Four times a day (QID) | ORAL | Status: DC | PRN

## 2012-01-21 MED ORDER — CITRIC ACID-SODIUM CITRATE 334-500 MG/5ML PO SOLN: 30.0000 mL | ORAL | Status: DC | PRN

## 2012-01-21 MED ORDER — PENICILLIN G POTASSIUM 5000000 UNITS IJ SOLR
5.0000 10*6.[IU] | Freq: Once | INTRAVENOUS | Status: AC
  Administered 2012-01-21: 5 10*6.[IU] via INTRAVENOUS
  Filled 2012-01-21: qty 5

## 2012-01-21 MED ORDER — FLEET ENEMA 7-19 GM/118ML RE ENEM: 1.0000 | ENEMA | RECTAL | Status: DC | PRN

## 2012-01-21 MED ORDER — LACTATED RINGERS IV SOLN
INTRAVENOUS | Status: DC
  Administered 2012-01-21 – 2012-01-22 (×4): via INTRAVENOUS

## 2012-01-21 MED ORDER — HYDROCORTISONE ACE-PRAMOXINE 1-1 % RE FOAM
1.0000 | Freq: Three times a day (TID) | RECTAL | Status: DC | PRN
  Filled 2012-01-21: qty 10

## 2012-01-21 MED ORDER — OXYTOCIN 40 UNITS IN LACTATED RINGERS INFUSION - SIMPLE MED
1.0000 m[IU]/min | INTRAVENOUS | Status: DC
  Administered 2012-01-21: 2 m[IU]/min via INTRAVENOUS
  Administered 2012-01-22: 4 m[IU]/min via INTRAVENOUS
  Administered 2012-01-22: 6 m[IU]/min via INTRAVENOUS
  Administered 2012-01-22: 2 m[IU]/min via INTRAVENOUS
  Filled 2012-01-21: qty 1000

## 2012-01-21 MED ORDER — OXYTOCIN BOLUS FROM INFUSION
250.0000 mL | Freq: Once | INTRAVENOUS | Status: DC
  Filled 2012-01-21: qty 500

## 2012-01-21 MED ORDER — LIDOCAINE HCL (PF) 1 % IJ SOLN
30.0000 mL | INTRAMUSCULAR | Status: DC | PRN
  Administered 2012-01-22: 30 mL via SUBCUTANEOUS
  Filled 2012-01-21: qty 30

## 2012-01-21 MED ORDER — ZOLPIDEM TARTRATE 5 MG PO TABS: 5.0000 mg | ORAL_TABLET | Freq: Every evening | ORAL | Status: DC | PRN

## 2012-01-21 MED ORDER — TERBUTALINE SULFATE 1 MG/ML IJ SOLN: 0.2500 mg | Freq: Once | INTRAMUSCULAR | Status: AC | PRN

## 2012-01-21 MED ORDER — NALBUPHINE SYRINGE 5 MG/0.5 ML
5.0000 mg | INJECTION | INTRAMUSCULAR | Status: DC | PRN
Start: 2012-01-21 — End: 2012-01-22

## 2012-01-21 MED ORDER — WITCH HAZEL-GLYCERIN EX PADS: MEDICATED_PAD | CUTANEOUS | Status: DC | PRN

## 2012-01-21 MED ORDER — PENICILLIN G POTASSIUM 5000000 UNITS IJ SOLR
2.5000 10*6.[IU] | INTRAVENOUS | Status: DC
  Administered 2012-01-21 (×3): 2.5 10*6.[IU] via INTRAVENOUS
  Filled 2012-01-21 (×5): qty 2.5

## 2012-01-21 MED ORDER — ONDANSETRON HCL 4 MG/2ML IJ SOLN: 4.0000 mg | Freq: Four times a day (QID) | INTRAMUSCULAR | Status: DC | PRN

## 2012-01-21 MED ORDER — OXYTOCIN 40 UNITS IN LACTATED RINGERS INFUSION - SIMPLE MED
62.5000 mL/h | Freq: Once | INTRAVENOUS | Status: DC
  Filled 2012-01-21: qty 1000

## 2012-01-21 MED ORDER — LACTATED RINGERS IV SOLN: 500.0000 mL | INTRAVENOUS | Status: DC | PRN

## 2012-01-21 MED ORDER — ACETAMINOPHEN 325 MG PO TABS: 650.0000 mg | ORAL_TABLET | ORAL | Status: DC | PRN

## 2012-01-21 MED ORDER — FLUTICASONE-SALMETEROL 500-50 MCG/DOSE IN AEPB
1.0000 | INHALATION_SPRAY | Freq: Two times a day (BID) | RESPIRATORY_TRACT | Status: DC
  Administered 2012-01-21 – 2012-01-22 (×3): 1 via RESPIRATORY_TRACT
  Filled 2012-01-21: qty 14

## 2012-01-21 MED ORDER — ALBUTEROL SULFATE (5 MG/ML) 0.5% IN NEBU
2.5000 mg | INHALATION_SOLUTION | RESPIRATORY_TRACT | Status: DC | PRN
  Filled 2012-01-21: qty 0.5

## 2012-01-21 MED ORDER — MISOPROSTOL 25 MCG QUARTER TABLET
25.0000 ug | ORAL_TABLET | ORAL | Status: AC
  Administered 2012-01-21 – 2012-01-22 (×3): 25 ug via ORAL
  Filled 2012-01-21 (×3): qty 0.25

## 2012-01-21 NOTE — Progress Notes (Addendum)
  Subjective: Showered, ate, ambulated--feeling much better.  Ready for cytotech.  Minimal SOB, unless she lays down.  Objective: BP 125/69  Pulse 61  Temp 98.1 F (36.7 C) (Oral)  Resp 18  Ht 5\' 7"  (1.702 m)  Wt 253 lb (114.76 kg)  BMI 39.63 kg/m2  SpO2 99%     Chest clear Heart RRR without murmur Ext WNL  FHT:  Category 1 UC:  Occasional, mild SVE:   Unchanged, vtx still ballotable, but is slightly lower in pelvis  Assessment / Plan: Serial induction for severe asthma Positive GBS  Plan: Cytotech 25 mcg placed in posterior fornix of vagina--repeat q 4 hours up to 3 doses. Will stop PCN dosing during night--restart with onset of labor or with initiation of pitocin Anticipate re-starting pitocin in am after cytotech regimen. Patient and partner agreeable with plan.  Nigel Bridgeman 01/21/2012, 8:51 PM

## 2012-01-21 NOTE — H&P (Signed)
[name] is a 31 y.o. female presenting for induction of labor due to uncontrolled asthma and shortness of breath.  Maternal Medical History:  Fetal activity: Perceived fetal activity is normal.    Prenatal complications: no prenatal complications Prenatal Complications - Diabetes: none.   Hx Present Preg:  Pt entered care at [redacted]wks gestation with [place] confirming dates.  Pt remained on baby ASA through 28wks.  Pt with nml [place] at 19wks.  Repeat US at 28wks with EFW at 87%ile.  Pt with acute exacerbation of asthma at 33wks.  Pt underwent maternal echo, doppler studies of extrems and pulmonology referral due to exacerbation of asthma with neg diagnostic findings.  Pt placed on inhaled corticosteroids for asthma control with improvement in patient's symptoms.    OB History    Grav Para Term Preterm Abortions TAB SAB Ect Mult Living   5 1 1  0 3 0 3 0 0 1     Past Medical History  Diagnosis Date  . Asthma   . H/O varicella   . H/O candidiasis   . H/O cystitis   . Abnormal Pap smear    Past Surgical History  Procedure Date  . Dilation and curettage of uterus 11/2010  . Dilation and evacuation 02/25/2011    Procedure: DILATATION AND EVACUATION (D&E);  Surgeon: Michael Litter, MD;  Location: [facility];  Service: Gynecology;  Laterality: N/A;  with chromosome studies   Family History: family history includes Diabetes in her sister and Heart disease in her maternal grandmother.  There is no history of Anesthesia problems, and Malignant hyperthermia, and Hypotension, and Pseudochol deficiency, . Social History:  reports that she has never smoked. She has never used smokeless tobacco. She reports that she does not drink alcohol or use illicit drugs.   Prenatal Transfer Tool  Maternal Diabetes: No Genetic Screening: Declined Maternal Ultrasounds/Referrals: Normal Fetal Ultrasounds or other Referrals:  Other: see prenatal record Maternal Substance Abuse:  No Significant Maternal Medications:   Advair 50/500 Significant Maternal Lab Results:  None Other Comments:  None  Review of Systems  Constitutional: Negative.   HENT: Negative.   Eyes: Negative.   Respiratory: Positive for shortness of breath.        Stable at present and increased with supine position.  Cardiovascular: Negative.   Gastrointestinal: Negative.   Genitourinary: Negative.   Musculoskeletal: Negative.   Skin: Negative.   Neurological: Negative.   Endo/Heme/Allergies: Negative.   Psychiatric/Behavioral: Negative.     Dilation: 2 Effacement (%): 60 Station: -2 Exam by:: Conni Elliot, CNM Blood pressure 128/58, pulse 82, temperature 98.3 F (36.8 C), temperature source Oral, resp. rate 18, height 5\' 7"  (1.702 m), weight 114.76 kg (253 lb). Maternal Exam:  Uterine Assessment: Contraction strength is mild.  Contraction frequency is irregular.   Abdomen: Patient reports no abdominal tenderness. Estimated fetal weight is 7#.   Fetal presentation: vertex  Introitus: Normal vulva. Normal vagina.  Ferning test: not done.  Nitrazine test: not done. Amniotic fluid character: not assessed.  Pelvis: adequate for delivery.   Cervix: Cervix evaluated by digital exam.     Fetal Exam Fetal Monitor Review: Mode: ultrasound.   Baseline rate: 155.  Variability: moderate (6-25 bpm).   Pattern: accelerations present.    Fetal State Assessment: Category I - tracings are normal.     Physical Exam  Constitutional: She is oriented to person, place, and time. She appears well-developed and well-nourished.  HENT:  Head: Normocephalic and atraumatic.  Right Ear:  External ear normal.  Left Ear: External ear normal.  Nose: Nose normal.  Eyes: Conjunctivae are normal. Pupils are equal, round, and reactive to light.  Neck: Normal range of motion. Neck supple. No thyromegaly present.  Cardiovascular: Normal rate, regular rhythm and intact distal pulses.   Respiratory: Effort normal and breath sounds normal.  GI:  Soft. Bowel sounds are normal.       Gravid  Genitourinary: Vagina normal and uterus normal.  Musculoskeletal: Normal range of motion.  Neurological: She is alert and oriented to person, place, and time. She has normal reflexes.  Skin: Skin is warm and dry.  Psychiatric: She has a normal mood and affect. Her behavior is normal.    Prenatal labs: ABO, Rh:   Antibody: Negative (11/29 0000) Rubella: Immune (05/29 0000) RPR: Nonreactive (11/29 0000)  HBsAg: Negative (11/29 0000)  HIV: Non-reactive (11/29 0000)  GBS: POSITIVE (06/20 0932)   Assessment/Plan: IUP at 37w 2d Uncontrolled asthma  Admitted to [facility] per consult with Dr. Normand Sloop.  Per Dr. Normand Sloop, Dr. Sherrie George, MFM, consulted prior to admission regarding IOL prior to 39wks and agrees beneficial to proceed with IOL at current gestational age due to patient's SOB and asthma symptoms.   RBA IOL d/w pt and pt desire to proceed.  Will use low dose Pitocin throughout the night tonight and then begin increasing dosage in the AM. Begin PCN when enters active labor.     Donnamae Muilenburg O. 01/21/2012, 12:46 AM

## 2012-01-21 NOTE — Progress Notes (Signed)
Repositioning frequently-will continue to assess-monitors adjusted

## 2012-01-21 NOTE — Progress Notes (Signed)
[name] is a 31 y.o. G5P1031 at [redacted]w[redacted]d by LMP admitted for induction of labor due to poorly controlled asthma and SOB.  Subjective: pt feeling SOB after exam   Objective: BP 123/76  Pulse 78  Temp 98.5 F (36.9 C) (Oral)  Resp 18  Ht 5\' 7"  (1.702 m)  Wt 253 lb (114.76 kg)  BMI 39.63 kg/m2  SpO2 100%      FHT:  FHR: 145 bpm, variability: moderate,  accelerations:  Present,  decelerations:  Absent UC:   irregular, every 2-7 minutes SVE:   Dilation: 2 Effacement (%): 60 Station: -2 Exam by:: Dr.Devlin Brink  Labs: Lab Results  Component Value Date   WBC 6.5 01/21/2012   HGB 11.5* 01/21/2012   HCT 34.1* 01/21/2012   MCV 89.3 01/21/2012   PLT 303 01/21/2012    Assessment / Plan: continue pitocin.  plan AROM as infant decends into the pelvis.  Nebulizer treatment.  Pt understands that it has been difficult to tx her asthma nd SOB.  she understands the risks of a 37 week induction.  she understands that there are some morbidity associated with induction before 39 weeks.  she also understnds that if her asthma continues to worsen the morbidity to her and the baby could be sever.  I spoke with Dr Sherrie George who agrees with the induction.    Labor: Progressing normally Preeclampsia:  no signs or symptoms of toxicity Fetal Wellbeing:  Category I Pain Control:  Labor support without medications I/D:  n/a Anticipated MOD:  NSVD  Lulani Bour A 01/21/2012, 12:20 PM

## 2012-01-21 NOTE — Progress Notes (Signed)
Pt sitting up on bed eating--difficult to trace-will continue to assess

## 2012-01-21 NOTE — Progress Notes (Signed)
  Subjective: Has slept at intervals, now wants to walk.  Aware of some contractions as mildly painful, most not.  Objective: BP 140/78  Pulse 91  Temp 98.1 F (36.7 C) (Oral)  Resp 18  Ht 5\' 7"  (1.702 m)  Wt 253 lb (114.76 kg)  BMI 39.63 kg/m2  SpO2 99%      FHT:  Category 1 UC:   irregular, every 2-5 minutes On 30 mu/min of pit since 3pm.  Assessment / Plan: Induction of labor due to severe asthma On pitocin  Plan: Continue current plan. Will re-evaluate cervix in next 2 hours 30 mu/min pitocin has been achieved.    Nigel Bridgeman 01/21/2012, 4:15 PM

## 2012-01-21 NOTE — Progress Notes (Signed)
  Subjective: Comfortable, aware of some contractions, but no pain.  Has been up on ball to vary position.  Objective: BP 140/69  Pulse 83  Temp 98.1 F (36.7 C) (Oral)  Resp 18  Ht 5\' 7"  (1.702 m)  Wt 253 lb (114.76 kg)  BMI 39.63 kg/m2  SpO2 99%      FHT: Category 1 UC:   Irregular, mild SVE: cervix posterior, 2 cm, 60%, vtx -2, ballotable still. Patient becomes significantly SOB when placed supine, then resolves with Semi-Fowler's.  O2 sat 99% on RA. On pitocin since 1am, on 30 mu/min since 3pm.   Assessment / Plan: Induction for severe asthma No cervical change  Plan: Consulted with Dr. Normand Sloop. Will d/c pitocin and ATB. Allow patient to eat and ambulate. Plan placement of cytotech at 7:30-8p. Patient agreeable with plan.   [name] 01/21/2012, 6:00 PM

## 2012-01-21 NOTE — Progress Notes (Signed)
  Subjective: Sleepy, but wants to walk after taking nap.  No worsening of baseline SOB.  Objective: BP 140/75  Pulse 69  Temp 98.5 F (36.9 C) (Oral)  Resp 18  Ht 5\' 7"  (1.702 m)  Wt 253 lb (114.76 kg)  BMI 39.63 kg/m2  SpO2 100%      FHT:  Category 1 UC:  q 3-5 min, mild Pitocin on 16 mu/min  Assessment / Plan: Induction due to asthma Will allow ambulation--check O2 sats before and after ambulation. Telemetry monitoring.  Nigel Bridgeman 01/21/2012, 9:37 AM

## 2012-01-21 NOTE — Progress Notes (Addendum)
  Subjective: Moderate pain with UCs, but declines pain medication at present.  Reports breathing is "stable" this am.  Occasional cough.  Denies fever, chills.  Does continue to have dyspnea on exertion or on lying supine. Receiving 1st dose of PCN for GBS.  Objective: BP 135/67  Pulse 69  Temp 98.1 F (36.7 C) (Oral)  Resp 18  Ht 5\' 7"  (1.702 m)  Wt 253 lb (114.76 kg)  BMI 39.63 kg/m2  O2 sat 100% on RA    FHT:   Category 1 UC:   q 3-5 min, mild/mod SVE:   Dilation: 2 Effacement (%): 50 Station: -2 Exam by:: V.Dearis Danis, CNM Cervix very posterior, unchanged. Pitocin on 10 mu/min  Labs: Lab Results  Component Value Date   WBC 6.5 01/21/2012   HGB 11.5* 01/21/2012   HCT 34.1* 01/21/2012   MCV 89.3 01/21/2012   PLT 303 01/21/2012    Assessment / Plan: Induction of labor at 37 2/7 weeks due to severe asthma GBS positive Early labor  Plan: Continue pitocin Plan AROM as cervix comes more foreward Reviewed plan of care, with recommendation for epidural as labor progresses--I would like to avoid diminished respiratory capacity related to narcotic use. Will continue patient on same regimen of Albuterol and Advair. Patient OK with CNM delivery.   Nigel Bridgeman 01/21/2012, 7:47 AM

## 2012-01-21 NOTE — Progress Notes (Signed)
Provider at bedside--discission made to turn off pitocin and IV fluids, and fetal monitoring to allow pt to eat and walk--orders to resume IV fluids and monitoring at 1930-2000

## 2012-01-22 ENCOUNTER — Encounter (HOSPITAL_COMMUNITY): Payer: Self-pay

## 2012-01-22 LAB — CBC
HCT: 37.9 % (ref 36.0–46.0)
Hemoglobin: 13.1 g/dL (ref 12.0–15.0)
MCH: 31 pg (ref 26.0–34.0)
MCHC: 34.6 g/dL (ref 30.0–36.0)
MCV: 89.8 fL (ref 78.0–100.0)

## 2012-01-22 MED ORDER — BUTORPHANOL TARTRATE 2 MG/ML IJ SOLN
1.0000 mg | INTRAMUSCULAR | Status: DC | PRN
  Administered 2012-01-22: 1 mg via INTRAVENOUS
  Filled 2012-01-22: qty 1

## 2012-01-22 MED ORDER — LACTATED RINGERS IV SOLN: 500.0000 mL | Freq: Once | INTRAVENOUS | Status: DC

## 2012-01-22 MED ORDER — DIPHENHYDRAMINE HCL 50 MG/ML IJ SOLN: 12.5000 mg | INTRAMUSCULAR | Status: DC | PRN

## 2012-01-22 MED ORDER — PENICILLIN G POTASSIUM 5000000 UNITS IJ SOLR
5.0000 10*6.[IU] | Freq: Once | INTRAVENOUS | Status: AC
  Administered 2012-01-22: 5 10*6.[IU] via INTRAVENOUS
  Filled 2012-01-22: qty 5

## 2012-01-22 MED ORDER — PHENYLEPHRINE 40 MCG/ML (10ML) SYRINGE FOR IV PUSH (FOR BLOOD PRESSURE SUPPORT): 80.0000 ug | PREFILLED_SYRINGE | INTRAVENOUS | Status: DC | PRN

## 2012-01-22 MED ORDER — PENICILLIN G POTASSIUM 5000000 UNITS IJ SOLR
2.5000 10*6.[IU] | INTRAVENOUS | Status: DC
  Administered 2012-01-22 (×3): 2.5 10*6.[IU] via INTRAVENOUS
  Filled 2012-01-22 (×8): qty 2.5

## 2012-01-22 MED ORDER — EPHEDRINE 5 MG/ML INJ: 10.0000 mg | INTRAVENOUS | Status: DC | PRN

## 2012-01-22 MED ORDER — FENTANYL 2.5 MCG/ML BUPIVACAINE 1/10 % EPIDURAL INFUSION (WH - ANES): 14.0000 mL/h | INTRAMUSCULAR | Status: DC

## 2012-01-22 NOTE — Progress Notes (Signed)
Pt. Moving side to side and up to side of bed and hand and knees. Difficult to trace FHR and uterine contractions

## 2012-01-22 NOTE — Progress Notes (Signed)
Breathing well with uc O VSS      fhts category 1      abd soft between uc      Contractions q 2-5 mild to mod      Vag 5 100 -1/-2 VTX Intact posterior A  37 3/7 week IUP induction P continue care Lavera Guise, CNM

## 2012-01-22 NOTE — Progress Notes (Addendum)
Comfortable feeling some uc, agrees to ARM O VSS      fhts category 1      abd soft between uc       Contractions mod poor tracing      Vag 4 100 -1 VTX arm large amount clear fluid A 37 4/7 weeK IUP induction P IV pit at 16 continue care Lavera Guise, CNM Addendum: Dr. Estanislado Pandy updated per telephone on pt status. Lavera Guise, CNM

## 2012-01-22 NOTE — Progress Notes (Signed)
Contractions hurting but I am OK O VSS tense with uc, breathing well      fhts category 1 130s      abd soft between uc       Contractions q3-4      Vag 5 100 -2 VTX Intact bulging foley bulb in vag vault removed intact A 37 3/7 week IUP induction P agrees to IV pitocin, Pen G resumes, Dr. Estanislado Pandy here and updated, continue care Lavera Guise, CNM

## 2012-01-22 NOTE — Progress Notes (Signed)
Up in halls ambulating, states contractions are not as strong Fhts category1 uc not tracing well Continue care Lavera Guise, CNM

## 2012-01-22 NOTE — Progress Notes (Signed)
Comfortable feeling contractions, slept last night, agrees to foley bulb O VSS Calm, no distress lungs clear bilaterally, rapid respirations with lying down for exam,  AP RRR, abd soft nt,abdomen nontender, Trace edema to lower extremities\BP 139/80  Pulse 66  Temp 98.6 F (37 C) (Oral)  Resp 22  Ht 5\' 7"  (1.702 m)  Wt 114.76 kg (253 lb)  BMI 39.63 kg/m2  SpO2 99% Results for orders placed during the hospital encounter of 01/20/12 (from the past 72 hour(s))  CBC     Status: Abnormal   Collection Time   01/21/12 12:25 AM      Component Value Range Comment   WBC 6.5  4.0 - 10.5 K/uL    RBC 3.82 (*) 3.87 - 5.11 MIL/uL    Hemoglobin 11.5 (*) 12.0 - 15.0 g/dL    HCT 16.1 (*) 09.6 - 46.0 %    MCV 89.3  78.0 - 100.0 fL    MCH 30.1  26.0 - 34.0 pg    MCHC 33.7  30.0 - 36.0 g/dL    RDW 04.5  40.9 - 81.1 %    Platelets 303  150 - 400 K/uL   RPR     Status: Normal   Collection Time   01/21/12 12:25 AM      Component Value Range Comment   RPR NON REACTIVE  NON REACTIVE   TYPE AND SCREEN     Status: Normal   Collection Time   01/21/12  1:24 AM      Component Value Range Comment   ABO/RH(D) O POS      Antibody Screen NEG      Sample Expiration 01/24/2012     fhts + uc q 2-5 mild Vag 2 70 -2/-3 VTX intact  A 37 3/7 week IUP induction for asthma P foley bulb place easily, IV pitocin at 0900 if indicated, Pen G, continue care Lavera Guise, CNM

## 2012-01-22 NOTE — Progress Notes (Signed)
SVD of viable female 

## 2012-01-22 NOTE — Progress Notes (Signed)
Notified Nigel Bridgeman CNM of FHR. Will continue to monitor.

## 2012-01-23 ENCOUNTER — Encounter (HOSPITAL_COMMUNITY): Payer: Self-pay

## 2012-01-23 LAB — US OB FOLLOW UP

## 2012-01-23 LAB — CBC
Hemoglobin: 9.7 g/dL — ABNORMAL LOW (ref 12.0–15.0)
MCH: 30 pg (ref 26.0–34.0)
MCHC: 33.3 g/dL (ref 30.0–36.0)
RDW: 14.7 % (ref 11.5–15.5)

## 2012-01-23 MED ORDER — WITCH HAZEL-GLYCERIN EX PADS: 1.0000 "application " | MEDICATED_PAD | CUTANEOUS | Status: DC | PRN

## 2012-01-23 MED ORDER — DIBUCAINE 1 % RE OINT: 1.0000 "application " | TOPICAL_OINTMENT | RECTAL | Status: DC | PRN

## 2012-01-23 MED ORDER — TETANUS-DIPHTH-ACELL PERTUSSIS 5-2.5-18.5 LF-MCG/0.5 IM SUSP: 0.5000 mL | Freq: Once | INTRAMUSCULAR | Status: DC

## 2012-01-23 MED ORDER — DIPHENHYDRAMINE HCL 25 MG PO CAPS: 25.0000 mg | ORAL_CAPSULE | Freq: Four times a day (QID) | ORAL | Status: DC | PRN

## 2012-01-23 MED ORDER — PRENATAL MULTIVITAMIN CH
1.0000 | ORAL_TABLET | Freq: Every day | ORAL | Status: DC
  Administered 2012-01-23 – 2012-01-24 (×2): 1 via ORAL
  Filled 2012-01-23 (×2): qty 1

## 2012-01-23 MED ORDER — LANOLIN HYDROUS EX OINT: TOPICAL_OINTMENT | CUTANEOUS | Status: DC | PRN

## 2012-01-23 MED ORDER — MEDROXYPROGESTERONE ACETATE 150 MG/ML IM SUSP: 150.0000 mg | INTRAMUSCULAR | Status: DC | PRN

## 2012-01-23 MED ORDER — PNEUMOCOCCAL VAC POLYVALENT 25 MCG/0.5ML IJ INJ
0.5000 mL | INJECTION | INTRAMUSCULAR | Status: AC
  Filled 2012-01-23: qty 0.5

## 2012-01-23 MED ORDER — ONDANSETRON HCL 4 MG/2ML IJ SOLN: 4.0000 mg | INTRAMUSCULAR | Status: DC | PRN

## 2012-01-23 MED ORDER — ALBUTEROL SULFATE HFA 108 (90 BASE) MCG/ACT IN AERS
2.0000 | INHALATION_SPRAY | Freq: Four times a day (QID) | RESPIRATORY_TRACT | Status: DC | PRN
  Filled 2012-01-23: qty 6.7

## 2012-01-23 MED ORDER — OXYCODONE-ACETAMINOPHEN 5-325 MG PO TABS
1.0000 | ORAL_TABLET | ORAL | Status: DC | PRN
  Administered 2012-01-24: 1 via ORAL
  Filled 2012-01-23: qty 1

## 2012-01-23 MED ORDER — SENNOSIDES-DOCUSATE SODIUM 8.6-50 MG PO TABS
2.0000 | ORAL_TABLET | Freq: Every day | ORAL | Status: DC
  Administered 2012-01-23: 2 via ORAL

## 2012-01-23 MED ORDER — ZOLPIDEM TARTRATE 5 MG PO TABS: 5.0000 mg | ORAL_TABLET | Freq: Every evening | ORAL | Status: DC | PRN

## 2012-01-23 MED ORDER — FLUTICASONE-SALMETEROL 500-50 MCG/DOSE IN AEPB
1.0000 | INHALATION_SPRAY | Freq: Two times a day (BID) | RESPIRATORY_TRACT | Status: DC
  Administered 2012-01-23 – 2012-01-24 (×3): 1 via RESPIRATORY_TRACT
  Filled 2012-01-23: qty 14

## 2012-01-23 MED ORDER — ONDANSETRON HCL 4 MG PO TABS: 4.0000 mg | ORAL_TABLET | ORAL | Status: DC | PRN

## 2012-01-23 MED ORDER — SIMETHICONE 80 MG PO CHEW: 80.0000 mg | CHEWABLE_TABLET | ORAL | Status: DC | PRN

## 2012-01-23 MED ORDER — BENZOCAINE-MENTHOL 20-0.5 % EX AERO
1.0000 "application " | INHALATION_SPRAY | CUTANEOUS | Status: DC | PRN
  Administered 2012-01-23 – 2012-01-24 (×2): 1 via TOPICAL
  Filled 2012-01-23 (×2): qty 56

## 2012-01-23 MED ORDER — MAGNESIUM HYDROXIDE 400 MG/5ML PO SUSP: 30.0000 mL | ORAL | Status: DC | PRN

## 2012-01-23 MED ORDER — HYDROCORTISONE ACE-PRAMOXINE 1-1 % RE FOAM
1.0000 | Freq: Two times a day (BID) | RECTAL | Status: DC
  Administered 2012-01-23 – 2012-01-24 (×4): 1 via RECTAL
  Filled 2012-01-23: qty 10

## 2012-01-23 MED ORDER — IBUPROFEN 600 MG PO TABS
600.0000 mg | ORAL_TABLET | Freq: Four times a day (QID) | ORAL | Status: DC
  Administered 2012-01-23 – 2012-01-24 (×6): 600 mg via ORAL
  Filled 2012-01-23 (×6): qty 1

## 2012-01-23 NOTE — Progress Notes (Signed)
S: comfortable, little bleeding, slept some     Breastfeeding well O VSS lungs clear bilaterally, AP RRR     abd soft, nt, ff      sm  Flow perineum clean intact 2 degree perineal well approximated no redness, edema     -Homans sign bilaterally,          Edema Hemoglobin & Hematocrit     Component Value Date/Time   HGB 9.7* 01/23/2012 0530   HCT 29.1* 01/23/2012 0530    A normal involution     Lactating     PP day 1 P continue care Lavera Guise, CNM

## 2012-01-23 NOTE — Progress Notes (Signed)
Post Partum Day 1 Subjective: no complaints and tolerating PO  Objective: Blood pressure 97/58, pulse 94, temperature 97.8 F (36.6 C), temperature source Oral, resp. rate 18, height 5\' 7"  (1.702 m), weight 253 lb (114.76 kg), SpO2 99.00%, unknown if currently breastfeeding.  Physical Exam:  General: alert, cooperative and no distress Lochia: appropriate Uterine Fundus: firm Incision: NA DVT Evaluation: No evidence of DVT seen on physical exam.   Basename 01/23/12 0530 01/22/12 2037  HGB 9.7* 13.1  HCT 29.1* 37.9    Assessment/Plan: Plan for discharge tomorrow and Breastfeeding Circumcision discussed Anemia   LOS: 3 days   [name] V 01/23/2012, 8:36 AM

## 2012-01-24 MED ORDER — IBUPROFEN 600 MG PO TABS: 600.0000 mg | ORAL_TABLET | Freq: Four times a day (QID) | ORAL | Status: AC | PRN

## 2012-01-24 NOTE — Discharge Summary (Signed)
Physician Discharge Summary  Patient ID: ARNA LUIS MRN: [id number] DOB/AGE: October 27, 1980 31 y.o.  Admit date: 01/20/2012 Discharge date: 01/24/2012  Admission Diagnoses: 37 week IUP induction for asthma  Discharge Diagnoses:  Active Problems:  Second degree laceration of perineum, delivered, current hospitalization  Vaginal delivery anemia  Discharged Condition: stable  Hospital Course: 37 week Induction, cytotec, pitocin, foley bulb, arm, SVD, normal involution Iron daily, pnv, mirilax discussed Consults: None  Significant Diagnostic Studies: labs:  Hemoglobin & Hematocrit     Component Value Date/Time   HGB 9.7* 01/23/2012 0530   HCT 29.1* 01/23/2012 0530      Treatments: IV hydration  Discharge Exam: Blood pressure 126/78, pulse 98, temperature 98.5 F (36.9 C), temperature source Oral, resp. rate 18, height 5\' 7"  (1.702 m), weight 253 lb (114.76 kg), SpO2 99.00%, unknown if currently breastfeeding. General appearance: alert, cooperative and no distress S: comfortable, little bleeding, slept little     Breast feeding O VSS     abd soft, nt, ff      sm  Flow perineum clean intact 2 MLL well approximated     -Homans sign bilaterally,        Trace R, +1 Ledema Disposition: 01-Home or Self Care  Discharge Orders    Future Orders Please Complete By Expires   OB RESULTS CONSOLE ABO/Rh      Comments:   This external order was created through the Results Console.     Medication List  As of 01/24/2012  8:14 AM   STOP taking these medications         hydrocortisone-pramoxine rectal foam         TAKE these medications         albuterol 108 (90 BASE) MCG/ACT inhaler   Commonly known as: PROVENTIL HFA;VENTOLIN HFA   Inhale 2 puffs into the lungs every 6 (six) hours as needed for wheezing.      Fluticasone-Salmeterol 500-50 MCG/DOSE Aepb   Commonly known as: ADVAIR   Inhale 1 puff into the lungs 2 (two) times daily.      ibuprofen 600 MG tablet   Commonly known  as: ADVIL,MOTRIN   Take 1 tablet (600 mg total) by mouth every 6 (six) hours as needed for pain.      prenatal multivitamin Tabs   Take 1 tablet by mouth daily.      witch hazel-glycerin pad   Commonly known as: TUCKS   Place 1 application rectally as needed. For hemorrhoids           Follow-up Information    Follow up with [facility].        [facility] handbook plans Mirena SignedLavera Guise 01/24/2012, 8:14 AM

## 2012-01-26 ENCOUNTER — Telehealth: Payer: Self-pay | Admitting: Obstetrics and Gynecology

## 2012-01-26 NOTE — Telephone Encounter (Signed)
Pt c/o difficulty nursing.  Stated she had called the lactation consultant and is waiting for a call back.  Also c/o a hard, painful lump in breast.  Not hot and no fever.  Patient was audibly upset and crying.  Notified pt that she would have to go to the hospital.  Pt states she would go right after her childs ped. Appt.  Notified SL that pt would be on her way for eval.  ld

## 2012-01-29 ENCOUNTER — Encounter: Payer: Self-pay | Admitting: Obstetrics and Gynecology

## 2012-01-29 ENCOUNTER — Ambulatory Visit (INDEPENDENT_AMBULATORY_CARE_PROVIDER_SITE_OTHER): Payer: 59 | Admitting: Obstetrics and Gynecology

## 2012-01-29 VITALS — BP 140/78 | Resp 14 | Wt 241.0 lb

## 2012-01-29 DIAGNOSIS — K649 Unspecified hemorrhoids: Secondary | ICD-10-CM

## 2012-01-29 HISTORY — DX: Unspecified hemorrhoids: K64.9

## 2012-01-29 NOTE — Progress Notes (Signed)
C/o of hemorrhoids no reflief with proctofom, tucks pads, nor prep H. Pt delivered 01/22/12.  Post partum 7 days.   c/o painful hemorrhoids. Examination: Examination of perineum- had 2nd degree laceration - healing well Hemorrhoid visible externally to right. Not too swollen and not hot to touch, perfused. Had been using Procto Foam HC - advised to continue use. Also advised to wet diaper and freeze and apply to perineum. Advised to lie on side while breast feeding when possible. Positions of breast feeding demonstrated.  Impression: swollen hemorrhoid post partum  Plan: Cold compresses and Hemorrhoid foam          Position change from sitting while breastfeeding

## 2012-01-30 ENCOUNTER — Telehealth: Payer: Self-pay | Admitting: Obstetrics and Gynecology

## 2012-01-31 ENCOUNTER — Ambulatory Visit (HOSPITAL_COMMUNITY)
Admission: RE | Admit: 2012-01-31 | Discharge: 2012-01-31 | Disposition: A | Discharge status: Home / Self Care | Payer: 59 | Source: Ambulatory Visit | Attending: Obstetrics and Gynecology | Admitting: Obstetrics and Gynecology

## 2012-01-31 DIAGNOSIS — O9279 Other disorders of lactation: Secondary | ICD-10-CM | POA: Insufficient documentation

## 2012-01-31 NOTE — Progress Notes (Addendum)
Adult Lactation Consultation Outpatient Visit Note  Patient Name: [name] Date of Birth: [birth year] Gestational Age at Delivery: Unknown Type of Delivery: Vaginal 01/22/2012 @ [facility]              Mom and baby present today due to challenges with engorgement that aren't resolving              And sore right nipple ( scabbed) and challenging latch on the right breast   Breastfeeding History:   Per Epic I/P report infant had latched well in the hospital ( (Latch Score = 7-10's )  Birth weight= 7.7.7oz  Discharge weight = 6-10oz 1st dr. Visit weight ( per mom) = 6-5oz on 7/5  F/U weight per mom = 7/ 8 ( smart start ) = 6-10oz  F/U weight per mom = 7/9 = 6-10oz  Frequency of Breastfeeding: every 2- 3 hours and the last 24 hours has started stretching feedings to every 3 hours                                                 And seems more satisfied Length of Feeding:10-20 mins   Voids: >6 Stools: 3 yellowish seedy stools   Supplementing / Method: permom breast empty well if baby feeds and then she pumps if still feeds.  Pumping:  Type of Pump: DEBP - Medela    Frequency:3-4 X's per day   Volume: 3 oz  @ one pumping   Comments:    Consultation Evaluation: Per mom last feeding was @ 0940 for 10 mins both breast ( approximately 40 mins prior to consult time )                Per mom I'm not sure if he will want to eat again so soon.       Assessed both breast ( with moms permission) - both breast full with nodules noted midline both breast only . Per mom left breast comfortable and without tenderness . Right nodules increased tenderness and sore right nipple ( with small scab) ( appears to be an old positional strip that hasn't healed)       Mom denies having an elevated temp, flu-like S/S 's, chills , or warm -hot breast,       During assessment had mom massage breast , hand express( which she does well ) consistent flow of milk.       Due to sore nipples, challenges with emptying  breast to a comfortable state - LC feels it has been due to not consistently emptying the breast every feeding. ( mom also reports breast have felt full after feeding both breast so she has had to resort in pumping.      Mom mentioned pumping is comfortable and is able to let down without challenges and #24 flange fits well , denies discomfort.   Initial Feeding Assessment: Right breast - ( due to soreness LC latched the baby on 1st to show mom the correct latch with depth to enhance                                              Breast emptying to relief over fullness. Infant weighed 1st, latched Skin  to skin , in  Football  Position instructing mom during the latch. Depth at the breast achieved and per mom could not believe the comfort level achieved ( moms comfort - What a difference).  Infant fed for 10 mins and released latch ( seemed relaxed and content) . Post weight done and diaper change ( yellowish seedy stool)         Mom's right nipple appeared normal shape when infant unlatched, and the tissue appeared healthier ( pointed the appearance  out to mom )         Per mom breast felt so more comfortable (        LC noted small shaped pea shaped nodule ( ? Plugged duct midline of breast 1-1/2 inches above areola)  Pre-feed Weight: 6-13.4 oz ( 3102g) ( mom excited the weight increased by 3 oz )  Post-feed Weight:6-14.2 oz ( 3124g)  Amount Transferred: 22ml  Comments: Infant fed in a consistent multiply swallowing pattern for 10 - without releasing until he ended the feeding on his own. Increased swallowing noted during breast compressions.   Additional Feeding Assessment: Relatched Right breast - after diaper changed  Pre-feed Weight: 6-13.1 oz ( 3094g)  Post-feed Weight: 6-13.3 oz ( 3098 g)  Amount Transferred: 4ml  Comments: Snack feeding for 5 mins Mom latched infant with minimal instruction and obtained 3 the depth, reported comfort                     Additional Feeding Assessment:  Left breast  Pre-feed Weight: 6-13.3oz ( 3098g)  Post-feed Weight: 6- 13.8 oz ( 3108g)  Amount Transferred: 10 ml  Comments: Fed 10 mins , mom independent with latch in the football  Position ( depth obtained and mom reports comfort)   Total Breast milk Transferred this Visit: 40 ml  ( keep in mind "Jogn" had fed 40 mins prior to consult ( didn't seem overly hungry )                                                                             ( both Mom and consultant were happy Jonny Ruiz woke up and was willing to latch so                                                                               We could see why mom is sore on the right breast and why she is having challenges  Emptying her breast bilaterally.  Total Supplement Given: none   Additional Interventions: Encouraged rest , naps , drinking to thirst ( esp H2O), nutritious snacks  and meals,                                          Feedings every 2-3 hours and on demand ( cluster feedings are normal esp with growth spurts)                                          Steps to latching - 1) If breast firm or greater ice for 15-20 mins 2) breast massage ,hand express                                                                       Important to make sure the aerola is compress able like a thin sandwich prior to latching                                                                        ( to assure a deep latch and enhancing let down)                                                                          3) Firm support and during feedings breast compressions to increasing the emptying of the breast.                             Stressed the im[portance of emptying  1st well to the soften stage prior to switching to the 2nd breast                              If John didn't latch to the 2nd hand express down to comfort ( pump is needed to comfort ( save milk)  and feed on 2nd breast                              Next feed.                              Tx for nodules ( ?pluggged ducts - heat during feedings and in between ( call if not resolved ) .   Follow-Up- Call [facility] ask when they would like a F/U weight check,  LC recommended considering the Breast feeding support group on                      Tuesday's at 11am at [facility] hospital for weight check                        Also to consider F/u with an O/P appointment if the [facility] plan from today's consult                        Needs some additions or changes.       Kathrin Greathouse 01/31/2012, 11:23 AM

## 2012-02-02 ENCOUNTER — Telehealth: Payer: Self-pay | Admitting: Obstetrics and Gynecology

## 2012-03-04 ENCOUNTER — Encounter: Payer: Self-pay | Admitting: Obstetrics and Gynecology

## 2012-03-04 ENCOUNTER — Ambulatory Visit (INDEPENDENT_AMBULATORY_CARE_PROVIDER_SITE_OTHER): Payer: 59 | Admitting: Obstetrics and Gynecology

## 2012-03-04 VITALS — BP 102/82 | Temp 98.0°F | Wt 223.0 lb

## 2012-03-04 DIAGNOSIS — Z309 Encounter for contraceptive management, unspecified: Secondary | ICD-10-CM

## 2012-03-04 DIAGNOSIS — IMO0001 Reserved for inherently not codable concepts without codable children: Secondary | ICD-10-CM

## 2012-03-04 NOTE — Progress Notes (Signed)
Date of delivery: 01/22/12 Female Name: Jonny Ruiz Vaginal delivery:yes Cesarean section:no Tubal ligation:no GDM:no Breast Feeding:yes Bottle Feeding:no Post-Partum Blues:no Abnormal pap:yes in the past Normal GU function: yes Normal GI function:yes Returning to work:yes EPDS: 6 . female who presents for a postpartum visit.  ABD: soft nontender GU: vulva normal no masses seen.  Vagina normal in appearance.  Cervix is parous and NT.  Uterus normal size.  No adnexal tenderness bilaterally or fullness EXT: no CCEB IUD, cultures done.  Use back up until mirena placed May resume intercourse , exercise and normal activity RT ASAP for mirena

## 2012-03-05 LAB — GC/CHLAMYDIA PROBE AMP, GENITAL: Chlamydia, DNA Probe: NEGATIVE

## 2012-03-22 ENCOUNTER — Ambulatory Visit (INDEPENDENT_AMBULATORY_CARE_PROVIDER_SITE_OTHER): Payer: 59 | Admitting: Obstetrics and Gynecology

## 2012-03-22 ENCOUNTER — Encounter: Payer: Self-pay | Admitting: Obstetrics and Gynecology

## 2012-03-22 VITALS — BP 140/76 | Wt 221.5 lb

## 2012-03-22 DIAGNOSIS — Z30430 Encounter for insertion of intrauterine contraceptive device: Secondary | ICD-10-CM

## 2012-03-22 DIAGNOSIS — IMO0002 Reserved for concepts with insufficient information to code with codable children: Secondary | ICD-10-CM

## 2012-03-22 MED ORDER — LEVONORGESTREL 20 MCG/24HR IU IUD
INTRAUTERINE_SYSTEM | Freq: Once | INTRAUTERINE | Status: AC
  Administered 2012-03-22: 1 via INTRAUTERINE

## 2012-03-22 NOTE — Patient Instructions (Signed)
Intrauterine Device Insertion Care After Refer to this sheet in the next few weeks. These instructions provide you with information on caring for yourself after your procedure. Your caregiver may also give you more specific instructions. Your treatment has been planned according to current medical practices, but problems sometimes occur. Call your caregiver if you have any problems or questions after your procedure. HOME CARE INSTRUCTIONS   Only take over-the-counter or prescription medicines for pain, discomfort, or fever as directed by your caregiver. Do not use aspirin. This may increase bleeding.   Check your IUD to make sure it is in place before you resume sexual activity. You should be able to feel the strings. If you cannot feel the strings, something may be wrong. The IUD may have fallen out of the uterus, or the uterus may have been punctured (perforated) during placement. Also, if the strings are getting longer, it may mean that the IUD is being forced out of the uterus. You no longer have full protection from pregnancy if any of these problems occur.   You may resume sexual intercourse if you are not having problems with the IUD. The IUD is considered immediately effective.   You may resume normal activities.   Keep all follow-up appointments to be sure your IUD has remained in place. After the first exam, yearly exams are advised, unless you cannot feel the strings of your IUD.   Continue to check that the IUD is still in place by feeling for the strings after every menstrual period.  SEEK MEDICAL CARE IF:   You have bleeding that is heavier or lasts longer than a normal menstrual cycle.   You have a fever.   You have increasing cramps or abdominal pain not relieved with medicine.   You have abdominal pain that does not seem to be related to the same area of earlier cramping and pain.   You are lightheaded, unusually weak, or faint.   You have abnormal vaginal discharge or  smells.   You have pain during sexual intercourse.   You cannot feel the IUD strings, or the IUD string has gotten longer.   You feel the IUD at the opening of the cervix in the vagina.   You think you are pregnant, or you miss your menstrual period.   The IUD string is hurting your sex partner.  Document Released: 03/08/2011 Document Revised: 06/29/2011 Document Reviewed: 03/08/2011 ExitCare Patient Information 2012 [employer]. 

## 2012-03-22 NOTE — Progress Notes (Signed)
Pt given Ibuprofen 600mg  prior to procedure.   IUD: Mirena LOT#: [id number] Exp: 07/2014 UPT: negative GC/CHLAMYDIA: negative 03/04/12 CONSENT SIGNED: yes DISINFECTION WITH betadine X3 UTERUS SOUNDED AT 8 CM IUD INSERTED PER PROTOCOL: yes COMPLICATION: none PATIENT INSTRUCTED TO CALL IS FEVER OR ABNORMAL PAIN:yes PATIENT INSTRUCTED ON HOW TO CHECK IUD STRINGS: yes FOLLOW UP APPT: 5- weeks

## 2012-04-24 ENCOUNTER — Encounter: Payer: 59 | Admitting: Obstetrics and Gynecology

## 2012-04-25 ENCOUNTER — Encounter: Payer: Self-pay | Admitting: Obstetrics and Gynecology

## 2012-04-25 ENCOUNTER — Encounter: Payer: 59 | Admitting: Obstetrics and Gynecology

## 2012-04-25 ENCOUNTER — Ambulatory Visit (INDEPENDENT_AMBULATORY_CARE_PROVIDER_SITE_OTHER): Payer: 59 | Admitting: Obstetrics and Gynecology

## 2012-04-25 VITALS — BP 130/80 | Wt 211.0 lb

## 2012-04-25 DIAGNOSIS — Z30431 Encounter for routine checking of intrauterine contraceptive device: Secondary | ICD-10-CM

## 2012-04-25 NOTE — Patient Instructions (Signed)
Intrauterine Device Insertion Care After Refer to this sheet in the next few weeks. These instructions provide you with information on caring for yourself after your procedure. Your caregiver may also give you more specific instructions. Your treatment has been planned according to current medical practices, but problems sometimes occur. Call your caregiver if you have any problems or questions after your procedure. HOME CARE INSTRUCTIONS   Only take over-the-counter or prescription medicines for pain, discomfort, or fever as directed by your caregiver. Do not use aspirin. This may increase bleeding.  Check your IUD to make sure it is in place before you resume sexual activity. You should be able to feel the strings. If you cannot feel the strings, something may be wrong. The IUD may have fallen out of the uterus, or the uterus may have been punctured (perforated) during placement. Also, if the strings are getting longer, it may mean that the IUD is being forced out of the uterus. You no longer have full protection from pregnancy if any of these problems occur.  You may resume sexual intercourse if you are not having problems with the IUD. The IUD is considered immediately effective.  You may resume normal activities.  Keep all follow-up appointments to be sure your IUD has remained in place. After the first exam, yearly exams are advised, unless you cannot feel the strings of your IUD.  Continue to check that the IUD is still in place by feeling for the strings after every menstrual period. SEEK MEDICAL CARE IF:   You have bleeding that is heavier or lasts longer than a normal menstrual cycle.  You have a fever.  You have increasing cramps or abdominal pain not relieved with medicine.  You have abdominal pain that does not seem to be related to the same area of earlier cramping and pain.  You are lightheaded, unusually weak, or faint.  You have abnormal vaginal discharge or  smells.  You have pain during sexual intercourse.  You cannot feel the IUD strings, or the IUD string has gotten longer.  You feel the IUD at the opening of the cervix in the vagina.  You think you are pregnant, or you miss your menstrual period.  The IUD string is hurting your sex partner. Document Released: 03/08/2011 Document Revised: 10/02/2011 Document Reviewed: 03/08/2011 ExitCare Patient Information 2013 [employer].  

## 2012-04-25 NOTE — Progress Notes (Signed)
Complaints: Continuous spotting Strings Visualized:yes Normal Bimanual:yes Lungs CTAB Other: n/a Follow up with AEX:yes Other: in 6 months Pt using albuterol inhaler occ.  Will call if she needs it more frequently

## 2012-09-07 ENCOUNTER — Other Ambulatory Visit: Payer: Self-pay

## 2013-05-29 ENCOUNTER — Other Ambulatory Visit: Payer: Self-pay

## 2013-07-02 IMAGING — CR DG CHEST 1V PORT
1 series · 1 of 1 positions shown · non-contrast
Comparison: None.

CLINICAL DATA: Shortness of breath.

PORTABLE CHEST - 1 VIEW

[view not recorded]
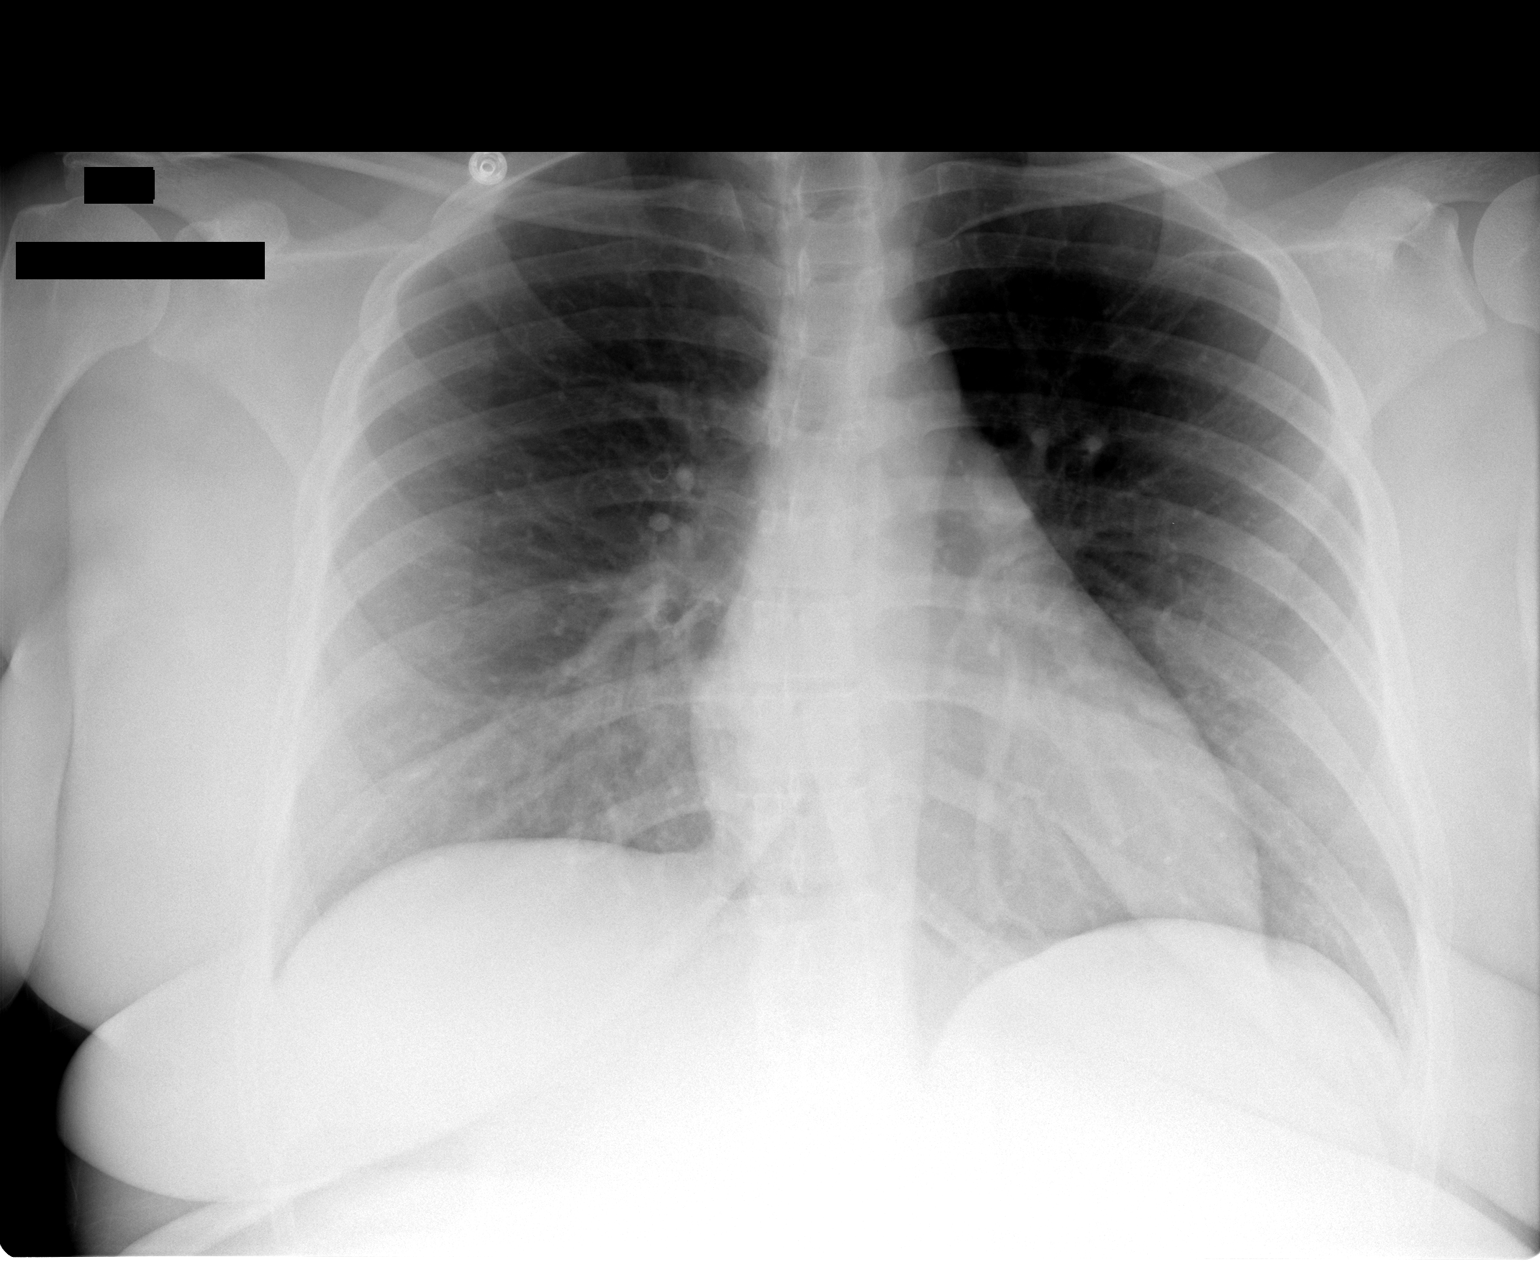

[1 of 1 positions shown; findings below may reference images not displayed]

FINDINGS: Lungs clear.  Heart size normal.  No pneumothorax or
effusion.
IMPRESSION: Negative chest.

## 2013-11-03 ENCOUNTER — Other Ambulatory Visit: Payer: Self-pay | Admitting: Gynecologic Oncology

## 2013-11-03 DIAGNOSIS — R2230 Localized swelling, mass and lump, unspecified upper limb: Secondary | ICD-10-CM

## 2013-11-03 NOTE — Progress Notes (Unsigned)
Patient presented to the office with a painless lump under the left axilla.  She noticed the area on Friday evening.  No pain, redness, increased warmth, or drainage.  She has never had a mammogram.  Area palpated as round and mobile measuring around 2 cm.  No abnormalities with palpation of the right axilla.  Diagnostic mammogram ordered of the left breast/axilla.

## 2013-11-07 ENCOUNTER — Other Ambulatory Visit: Payer: 59

## 2013-12-31 LAB — HM PAP SMEAR

## 2014-02-05 ENCOUNTER — Telehealth: Payer: Self-pay | Admitting: Internal Medicine

## 2014-02-05 NOTE — Telephone Encounter (Signed)
Called spoke with pt. appt scheduled to come in and see [facility] tomorrow. Nothing further needed

## 2014-02-06 ENCOUNTER — Ambulatory Visit: Payer: 59 | Admitting: Pulmonary Disease

## 2014-03-17 ENCOUNTER — Encounter: Payer: Self-pay | Admitting: Internal Medicine

## 2014-03-17 ENCOUNTER — Ambulatory Visit: Payer: 59 | Admitting: Internal Medicine

## 2014-03-17 ENCOUNTER — Ambulatory Visit (INDEPENDENT_AMBULATORY_CARE_PROVIDER_SITE_OTHER): Payer: 59 | Admitting: Internal Medicine

## 2014-03-17 ENCOUNTER — Ambulatory Visit (INDEPENDENT_AMBULATORY_CARE_PROVIDER_SITE_OTHER): Payer: 59 | Admitting: Family Medicine

## 2014-03-17 ENCOUNTER — Encounter: Payer: Self-pay | Admitting: Family Medicine

## 2014-03-17 VITALS — BP 122/82 | HR 55 | Ht 67.0 in | Wt 168.0 lb

## 2014-03-17 VITALS — BP 110/68 | HR 48 | Temp 97.6°F | Ht 66.75 in | Wt 168.0 lb

## 2014-03-17 DIAGNOSIS — R0600 Dyspnea, unspecified: Secondary | ICD-10-CM

## 2014-03-17 DIAGNOSIS — R0609 Other forms of dyspnea: Secondary | ICD-10-CM

## 2014-03-17 DIAGNOSIS — E663 Overweight: Secondary | ICD-10-CM

## 2014-03-17 DIAGNOSIS — Z7189 Other specified counseling: Secondary | ICD-10-CM

## 2014-03-17 DIAGNOSIS — J45902 Unspecified asthma with status asthmaticus: Secondary | ICD-10-CM

## 2014-03-17 DIAGNOSIS — J45901 Unspecified asthma with (acute) exacerbation: Secondary | ICD-10-CM

## 2014-03-17 DIAGNOSIS — Z7689 Persons encountering health services in other specified circumstances: Secondary | ICD-10-CM

## 2014-03-17 DIAGNOSIS — R0989 Other specified symptoms and signs involving the circulatory and respiratory systems: Secondary | ICD-10-CM

## 2014-03-17 DIAGNOSIS — J4541 Moderate persistent asthma with (acute) exacerbation: Secondary | ICD-10-CM

## 2014-03-17 DIAGNOSIS — J4522 Mild intermittent asthma with status asthmaticus: Secondary | ICD-10-CM

## 2014-03-17 DIAGNOSIS — R0601 Orthopnea: Secondary | ICD-10-CM

## 2014-03-17 DIAGNOSIS — R0689 Other abnormalities of breathing: Secondary | ICD-10-CM

## 2014-03-17 MED ORDER — OMEPRAZOLE 20 MG PO CPDR: 20.0000 mg | DELAYED_RELEASE_CAPSULE | Freq: Every day | ORAL | Status: DC

## 2014-03-17 MED ORDER — ALBUTEROL SULFATE HFA 108 (90 BASE) MCG/ACT IN AERS: 2.0000 | INHALATION_SPRAY | Freq: Four times a day (QID) | RESPIRATORY_TRACT | Status: AC | PRN

## 2014-03-17 MED ORDER — FLUTICASONE-SALMETEROL 100-50 MCG/DOSE IN AEPB: 1.0000 | INHALATION_SPRAY | Freq: Two times a day (BID) | RESPIRATORY_TRACT | Status: DC

## 2014-03-17 NOTE — Progress Notes (Signed)
Subjective:    Patient ID: [name], female    DOB: [birth year], 33 y.o.   MRN: [id number]  HPI    OV 03/17/2014  Chief Complaint  Patient presents with  . Follow-up    Pt states that she is just gettin over a cold. Pt is c/o orthopnea, pt using 4 pillows at night. Pt c/o DOE, mild dry cough. Pt denies CP/tightness. Pt last seen in 12/4035.    33 year old female African American for dyspnea and asthma evaluation  She reports a history of childhood asthma diagnosed at age 16 when she was admitted for the same. After that she outgrew her asthma and was asymptomatic even to her first pregnancy in 2005. Than in the summer of 2013 during her second pregnancy she reports that her asthma flared up and she was extremely symptomatic with shortness of breath. At that time she did have pulmonary evaluation. She was placed on scheduled Advair which did help during that time per hx  Echocardiogram 01/03/2012 showed a left ventricular ejection fraction of 55% (which to me now is some low for a pregnancy state). Chart review shows inductions of labor at 37 weeks due to asthma. After that she did not followup with pulmonary. She reports that ever since her delivery she has always been orthopneic needing 4 pillows to sleep at night. Other than that she's had 4 episodes of respiratory upper infection during which time she develops significant worsening of shortness of breath but also developed cough and wheezing. The symptoms have been improved by albuterol for rescue and Advair. In between these episodes she has been fairly asymptomatic other than her baseline orthopnea and also dyspnea for exercise. This exercise-related dyspnea is improved by pre-treatment with albuterol.   Due to persistence of symptoms and concern of severe asthma she has been referred here. She denies any paroxysmal nocturnal dyspnea, edema, chest pain  Spirometry the office today: she did 5 attempts, accepted only 2 - numbers are  normal (personally reviewed). She had lot of cough    has a past medical history of Asthma; H/O varicella; H/O candidiasis; H/O cystitis; Abnormal Pap smear; and Preterm labor.   has past surgical history that includes Dilation and curettage of uterus (11/2010) and Dilation and evacuation (02/25/2011).  Current outpatient prescriptions:Clindamycin Phos-Benzoyl Perox (ONEXTON) 1.2-3.75 % GEL, Apply topically daily., Disp: , Rfl: ;  Fluticasone-Salmeterol (ADVAIR) 500-50 MCG/DOSE AEPB, Inhale 1 puff into the lungs 2 (two) times daily., Disp: 60 each, Rfl: 4;  levonorgestrel (MIRENA) 20 MCG/24HR IUD, 1 each by Intrauterine route once., Disp: , Rfl:  albuterol (PROVENTIL HFA;VENTOLIN HFA) 108 (90 BASE) MCG/ACT inhaler, Inhale 2 puffs into the lungs every 6 (six) hours as needed for wheezing., Disp: 1 Inhaler, Rfl: 4    reports that she has never smoked. She has never used smokeless tobacco.    Review of Systems  Constitutional: Negative for fever and unexpected weight change.  HENT: Negative for congestion, dental problem, ear pain, nosebleeds, postnasal drip, rhinorrhea, sinus pressure, sneezing, sore throat and trouble swallowing.   Eyes: Negative for redness and itching.  Respiratory: Positive for cough and shortness of breath. Negative for chest tightness and wheezing.   Cardiovascular: Negative for palpitations and leg swelling.  Gastrointestinal: Negative for nausea and vomiting.  Genitourinary: Negative for dysuria.  Musculoskeletal: Negative for joint swelling.  Skin: Negative for rash.  Neurological: Negative for headaches.  Hematological: Does not bruise/bleed easily.  Psychiatric/Behavioral: Negative for dysphoric mood. The patient is not nervous/anxious.  Objective:   Physical Exam  Vitals reviewed. Constitutional: She is oriented to person, place, and time. She appears well-developed and well-nourished. No distress.  HENT:  Head: Normocephalic and atraumatic.  Right  Ear: External ear normal.  Left Ear: External ear normal.  Mouth/Throat: Oropharynx is clear and moist. No oropharyngeal exudate.  Eyes: Conjunctivae and EOM are normal. Pupils are equal, round, and reactive to light. Right eye exhibits no discharge. Left eye exhibits no discharge. No scleral icterus.  Neck: Normal range of motion. Neck supple. No JVD present. No tracheal deviation present. No thyromegaly present.  Cardiovascular: Normal rate, regular rhythm, normal heart sounds and intact distal pulses.  Exam reveals no gallop and no friction rub.   No murmur heard. Pulmonary/Chest: Effort normal and breath sounds normal. No respiratory distress. She has no wheezes. She has no rales. She exhibits no tenderness.  Abdominal: Soft. Bowel sounds are normal. She exhibits no distension and no mass. There is no tenderness. There is no rebound and no guarding.  Musculoskeletal: Normal range of motion. She exhibits no edema and no tenderness.  Lymphadenopathy:    She has no cervical adenopathy.  Neurological: She is alert and oriented to person, place, and time. She has normal reflexes. No cranial nerve deficit. She exhibits normal muscle tone. Coordination normal.  Skin: Skin is warm and dry. No rash noted. She is not diaphoretic. No erythema. No pallor.  Psychiatric: She has a normal mood and affect. Her behavior is normal. Judgment and thought content normal.    Filed Vitals:   03/17/14 1117  BP: 122/82  Pulse: 55  Height: 5\' 7"  (1.702 m)  Weight: 168 lb (76.204 kg)  SpO2: 99%          Assessment & Plan:  Symptoms of shortness of breath could be due to asthma, acid reflux or occasionally heart Please do ECHO - indication: orthopnea and dyspnea  Please committ to taking advair 100/50, 1 puft twice daily without fail on scheduled basis  - you can only stop when I tell you to Please start prilosec 20mg  once daily on empty stomach  Followup  - return to see me or my NP in 1 month

## 2014-03-17 NOTE — Patient Instructions (Signed)
-  lease check on tdap and schedule nurse visit if you need this - flu vaccine  -schedule fasting lab appointment  -We have ordered labs or studies at this visit. It can take up to 1-2 weeks for results and processing. We will contact you with instructions IF your results are abnormal. Normal results will be released to your [facility]. If you have not heard from [place] or can not find your results in [facility] in 2 weeks please contact our office.  -PLEASE SIGN UP FOR MYCHART TODAY   We recommend the following healthy lifestyle measures: - eat a healthy diet consisting of lots of vegetables, fruits, beans, nuts, seeds, healthy meats such as white chicken and fish and whole grains.  - avoid fried foods, fast food, processed foods, sodas, red meet and other fattening foods.  - get a least 150 minutes of aerobic exercise per week.   Follow up in: 1 year

## 2014-03-17 NOTE — Progress Notes (Signed)
Pre visit review using our clinic review tool, if applicable. No additional management support is needed unless otherwise documented below in the visit note. 

## 2014-03-17 NOTE — Patient Instructions (Addendum)
Symptoms of shortness of breath could be due to asthma, acid reflux or occasionally heart Please do ECHO - indication: orthopnea and dyspnea  Please committ to taking advair 100/50, 1 puft twice daily without fail on scheduled basis  - you can only stop when I tell you to Please start prilosec 20mg  once daily on empty stomach  Followup  - return to see me or my NP in 1 month

## 2014-03-17 NOTE — Progress Notes (Signed)
No chief complaint on file.   HPI:  [name] is here to establish care.  Last PCP and physical: 12/2013 pap normal per her report  Has the following chronic problems and concerns today:  Patient Active Problem List   Diagnosis Date Noted  . Asthma, not well controlled 01/16/2012  . Pedal edema 12/21/2011  . Obesity 11/17/2011  . LGSIL (low grade squamous intraepithelial dysplasia) 11/02/2011   DOE, Orthopnea and Asthma: -saw her pulmonologist (Dr. Chase Caller) today - notes reviewed - appears echo ordered, advair and prilosec advised and has close follow up -appreciate care -reports asthma symptoms since last pregnancy with VURI primarily and asthma  Hx abnormal pap/Hx IUD: -managed by her gynecologist, Dr. Charlesetta Garibaldi  - from notes though has not seen gyn in 2 years despite recs for aex in 2014 - reports had pap and physical with Dr. Charlesetta Garibaldi in June of this year - she is sure and normal pap  She wants to check - thinks may have had tdap with ob in 2013   ROS negative for unless reported above: fevers, unintentional weight loss, hearing or vision loss, chest pain, palpitations, hemoptysis, melena, hematochezia, hematuria, falls, loc, si, thoughts of self harm  Past Medical History  Diagnosis Date  . Asthma   . H/O varicella   . H/O candidiasis   . H/O cystitis   . Abnormal Pap smear     Colposcopy   . Preterm labor     with 1st pregnancy  . Hemorrhoids 01/29/2012  . LGSIL (low grade squamous intraepithelial dysplasia) 11/02/2011    Last pap WNL   . Obesity 11/17/2011  . Pedal edema 12/21/2011    Family History  Problem Relation Age of Onset  . Anesthesia problems Neg Hx   . Malignant hyperthermia Neg Hx   . Hypotension Neg Hx   . Pseudochol deficiency Neg Hx   . Diabetes Sister     type II  . Heart disease Maternal Grandmother     MI    History   Social History  . Marital Status: Married    Spouse Name: N/A    Number of Children: N/A  . Years of  Education: N/A   Social History Main Topics  . Smoking status: Never Smoker   . Smokeless tobacco: Never Used  . Alcohol Use: No  . Drug Use: No  . Sexual Activity: None     Comment: Mirena inserted 03/22/12   Other Topics Concern  . 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[employer] Situation: lives with husband and 2 boy (74 and 2 yo in 2015)      Spiritual Beliefs: Christian      Lifestyle: regular CV exercise - 30-40 minutes of zumba daily; diet is good             Current outpatient prescriptions:albuterol (PROVENTIL HFA;VENTOLIN HFA) 108 (90 BASE) MCG/ACT inhaler, Inhale 2 puffs into the lungs every 6 (six) hours as needed for wheezing., Disp: 1 Inhaler, Rfl: 4;  Clindamycin Phos-Benzoyl Perox (ONEXTON) 1.2-3.75 % GEL, Apply topically daily., Disp: , Rfl: ;  Fluticasone-Salmeterol (ADVAIR DISKUS) 100-50 MCG/DOSE AEPB, Inhale 1 puff into the lungs 2 (two) times daily., Disp: 60 each, Rfl: 5 levonorgestrel (MIRENA) 20 MCG/24HR IUD, 1 each by Intrauterine route once., Disp: , Rfl: ;  omeprazole (PRILOSEC) 20 MG capsule, Take 1 capsule (20 mg total) by mouth daily.,  Disp: 30 capsule, Rfl: 5  EXAM:  Filed Vitals:   03/17/14 1612  BP: 110/68  Pulse: 48  Temp: 97.6 F (36.4 C)    Body mass index is 26.52 kg/(m^2).  GENERAL: vitals reviewed and listed above, alert, oriented, appears well hydrated and in no acute distress  HEENT: atraumatic, conjunttiva clear, no obvious abnormalities on inspection of external nose and ears  NECK: no obvious masses on inspection  LUNGS: clear to auscultation bilaterally, no wheezes, rales or rhonchi, good air movement  CV: HRRR, no peripheral edema  MS: moves all extremities without noticeable abnormality  PSYCH: pleasant and cooperative, no obvious depression or anxiety  ASSESSMENT AND PLAN:  Discussed the following assessment and plan:  Asthma, not well  controlled, mild intermittent, with status asthmaticus  Encounter to establish care  Overweight - Plan: Lipid Panel, Hemoglobin A1c  -We reviewed the PMH, PSH, FH, SH, Meds and Allergies. -We provided refills for any medications we will prescribe as needed. -We addressed current concerns per orders and patient instructions. -We have asked for records for pertinent exams, studies, vaccines and notes from previous providers. -We have advised patient to follow up per instructions below. -advised her to get schedule echo and follow up with her pulmonologist as planned -advised her to schedule follow up with her gynecologist  -Patient advised to return or notify a doctor immediately if symptoms worsen or persist or new concerns arise.  Patient Instructions  -lease check on tdap and schedule nurse visit if you need this - flu vaccine  -schedule fasting lab appointment  -We have ordered labs or studies at this visit. It can take up to 1-2 weeks for results and processing. We will contact you with instructions IF your results are abnormal. Normal results will be released to your [facility]. If you have not heard from [place] or can not find your results in [facility] in 2 weeks please contact our office.  -PLEASE SIGN UP FOR MYCHART TODAY   We recommend the following healthy lifestyle measures: - eat a healthy diet consisting of lots of vegetables, fruits, beans, nuts, seeds, healthy meats such as white chicken and fish and whole grains.  - avoid fried foods, fast food, processed foods, sodas, red meet and other fattening foods.  - get a least 150 minutes of aerobic exercise per week.   Follow up in: 1 year      Laquinta Hazell, Nickola Major.

## 2014-03-18 DIAGNOSIS — R0600 Dyspnea, unspecified: Secondary | ICD-10-CM | POA: Insufficient documentation

## 2014-03-18 DIAGNOSIS — R0689 Other abnormalities of breathing: Secondary | ICD-10-CM

## 2014-03-18 DIAGNOSIS — R0601 Orthopnea: Secondary | ICD-10-CM | POA: Insufficient documentation

## 2014-03-19 ENCOUNTER — Other Ambulatory Visit (HOSPITAL_COMMUNITY): Payer: Self-pay | Admitting: Internal Medicine

## 2014-03-19 DIAGNOSIS — R0602 Shortness of breath: Secondary | ICD-10-CM

## 2014-03-20 ENCOUNTER — Ambulatory Visit (HOSPITAL_COMMUNITY): Payer: 59 | Attending: Cardiology | Admitting: Radiology

## 2014-03-20 DIAGNOSIS — R0601 Orthopnea: Secondary | ICD-10-CM | POA: Insufficient documentation

## 2014-03-20 DIAGNOSIS — R0609 Other forms of dyspnea: Secondary | ICD-10-CM | POA: Diagnosis present

## 2014-03-20 DIAGNOSIS — Z8249 Family history of ischemic heart disease and other diseases of the circulatory system: Secondary | ICD-10-CM | POA: Insufficient documentation

## 2014-03-20 DIAGNOSIS — R0989 Other specified symptoms and signs involving the circulatory and respiratory systems: Principal | ICD-10-CM | POA: Insufficient documentation

## 2014-03-20 DIAGNOSIS — R0602 Shortness of breath: Secondary | ICD-10-CM

## 2014-03-20 NOTE — Progress Notes (Signed)
Echocardiogram performed.  

## 2014-03-26 NOTE — Progress Notes (Signed)
Quick Note:  Called and spoke to pt. Informed pt of results and recs per MR. Pt verbalized understanding and denied any further questions or concerns at this time. ______ 

## 2014-04-01 ENCOUNTER — Other Ambulatory Visit (INDEPENDENT_AMBULATORY_CARE_PROVIDER_SITE_OTHER): Payer: 59

## 2014-04-01 DIAGNOSIS — E663 Overweight: Secondary | ICD-10-CM

## 2014-04-01 LAB — LIPID PANEL
CHOLESTEROL: 185 mg/dL (ref 0–200)
HDL: 51.4 mg/dL (ref >39.00)
LDL Cholesterol: 123 mg/dL — ABNORMAL HIGH (ref 0–99)
NONHDL: 133.6
Total CHOL/HDL Ratio: 4
Triglycerides: 54 mg/dL (ref 0.0–149.0)
VLDL: 10.8 mg/dL (ref 0.0–40.0)

## 2014-04-01 LAB — HEMOGLOBIN A1C: HEMOGLOBIN A1C: 5.5 % (ref 4.6–6.5)

## 2014-04-17 ENCOUNTER — Encounter: Payer: Self-pay | Admitting: Internal Medicine

## 2014-04-17 ENCOUNTER — Ambulatory Visit (INDEPENDENT_AMBULATORY_CARE_PROVIDER_SITE_OTHER): Payer: 59 | Admitting: Internal Medicine

## 2014-04-17 VITALS — BP 126/82 | HR 103 | Ht 67.5 in | Wt 166.0 lb

## 2014-04-17 DIAGNOSIS — R0601 Orthopnea: Secondary | ICD-10-CM

## 2014-04-17 DIAGNOSIS — Z23 Encounter for immunization: Secondary | ICD-10-CM

## 2014-04-17 DIAGNOSIS — J383 Other diseases of vocal cords: Secondary | ICD-10-CM

## 2014-04-17 NOTE — Progress Notes (Signed)
Subjective:    Patient ID: [name], female    DOB: [birth year], 33 y.o.   MRN: [id number]  HPI   OV 04/17/2014  Chief Complaint  Patient presents with  . Follow-up    Pt here after echo. Pt c/o orthopnea and is down to using 3 pillows and is unable to lessen. Pt states she is using her albuterol before working out. Pt denies CP/tightness.     OV 03/17/2014  Chief Complaint  Patient presents with  . Follow-up    Pt states that she is just gettin over a cold. Pt is c/o orthopnea, pt using 4 pillows at night. Pt c/o DOE, mild dry cough. Pt denies CP/tightness. Pt last seen in 12/3687.    33 year old female African American for dyspnea and asthma evaluation  She reports a history of childhood asthma diagnosed at age 57 when she was admitted for the same. After that she outgrew her asthma and was asymptomatic even to her first pregnancy in 2005. Than in the summer of 2013 during her second pregnancy she reports that her asthma flared up and she was extremely symptomatic with shortness of breath. At that time she did have pulmonary evaluation. She was placed on scheduled Advair which did help during that time per hx  Echocardiogram 01/03/2012 showed a left ventricular ejection fraction of 55% (which to me now is some low for a pregnancy state). Chart review shows inductions of labor at 37 weeks due to asthma. After that she did not followup with pulmonary. She reports that ever since her delivery she has always been orthopneic needing 4 pillows to sleep at night. Other than that she's had 4 episodes of respiratory upper infection during which time she develops significant worsening of shortness of breath but also developed cough and wheezing. The symptoms have been improved by albuterol for rescue and Advair. In between these episodes she has been fairly asymptomatic other than her baseline orthopnea and also dyspnea for exercise. This exercise-related dyspnea is improved by pre-treatment  with albuterol.   Due to persistence of symptoms and concern of severe asthma she has been referred here. She denies any paroxysmal nocturnal dyspnea, edema, chest pain  Spirometry the office today: she did 5 attempts, accepted only 2 - numbers are normal (personally reviewed). She had lot of cough    OV 04/17/2014  Chief Complaint  Patient presents with  . Follow-up    Pt here after echo. Pt c/o orthopnea and is down to using 3 pillows and is unable to lessen. Pt states she is using her albuterol before working out. Pt denies CP/tightness.     Followup orthopnea in the setting of medical history of asthma when she was pregnant   - She is here following up for tests as above echocardiogram which was normal. She's completed a therapeutic trial of both Prilosec and Advair compliantly according to the history. She still continues these medications. Despite these she continues to be significantly orthopneic. She says she is otherwise well and exercises but when she lies flat she is very dyspneic and wheezes. She is now using 3 pillows to sleep. She is extremely what about her condition. She and her husband are planning to have a third child and she is worried how this might affect her inability to get pregnant again  - There are no other new issues  - P I made her lie flast i the bed and was in 15 seconds she got very dyspneic. I could  auscultate wheeze mostly coming from the throat but it was mild. She was tachycardic with a heart rate of 120. Pulse ox was 94% and did not desaturate. There was no paradoxical abdominal excursions. Within 45 seconds to 1 minute she got up saying she could not lie flat anymore. The wheezing seemed to resolve and she was sitting upright. The palms were cold and moist during this time  Review of Systems  Constitutional: Negative for fever and unexpected weight change.  HENT: Negative for congestion, dental problem, ear pain, nosebleeds, postnasal drip, rhinorrhea,  sinus pressure, sneezing, sore throat and trouble swallowing.   Eyes: Negative for redness and itching.  Respiratory: Negative for cough, chest tightness, shortness of breath and wheezing.   Cardiovascular: Negative for palpitations and leg swelling.  Gastrointestinal: Negative for nausea and vomiting.  Genitourinary: Negative for dysuria.  Musculoskeletal: Negative for joint swelling.  Skin: Negative for rash.  Neurological: Negative for headaches.  Hematological: Does not bruise/bleed easily.  Psychiatric/Behavioral: Negative for dysphoric mood. The patient is not nervous/anxious.    Current outpatient prescriptions:albuterol (PROVENTIL HFA;VENTOLIN HFA) 108 (90 BASE) MCG/ACT inhaler, Inhale 2 puffs into the lungs every 6 (six) hours as needed for wheezing., Disp: 1 Inhaler, Rfl: 4;  Fluticasone-Salmeterol (ADVAIR DISKUS) 100-50 MCG/DOSE AEPB, Inhale 1 puff into the lungs 2 (two) times daily., Disp: 60 each, Rfl: 5;  levonorgestrel (MIRENA) 20 MCG/24HR IUD, 1 each by Intrauterine route once., Disp: , Rfl:  omeprazole (PRILOSEC) 20 MG capsule, Take 1 capsule (20 mg total) by mouth daily., Disp: 30 capsule, Rfl: 5     Objective:   Physical Exam  Vitals reviewed. Constitutional: She is oriented to person, place, and time. She appears well-developed and well-nourished. No distress.  Dyspneic and wheezing and tachycardic with moist cold hands when made to lie flat. Developed wheeze. No paradoxical abdominal excursion  HENT:  Head: Normocephalic and atraumatic.  Right Ear: External ear normal.  Left Ear: External ear normal.  Mouth/Throat: Oropharynx is clear and moist. No oropharyngeal exudate.  Eyes: Conjunctivae and EOM are normal. Pupils are equal, round, and reactive to light. Right eye exhibits no discharge. Left eye exhibits no discharge. No scleral icterus.  Neck: Normal range of motion. Neck supple. No JVD present. No tracheal deviation present. No thyromegaly present.    Cardiovascular: Normal rate, regular rhythm, normal heart sounds and intact distal pulses.  Exam reveals no gallop and no friction rub.   No murmur heard. Pulmonary/Chest: Effort normal and breath sounds normal. No respiratory distress. She has no wheezes. She has no rales. She exhibits no tenderness.  Abdominal: Soft. Bowel sounds are normal. She exhibits no distension and no mass. There is no tenderness. There is no rebound and no guarding.  Musculoskeletal: Normal range of motion. She exhibits no edema and no tenderness.  Cold moist hands  Lymphadenopathy:    She has no cervical adenopathy.  Neurological: She is alert and oriented to person, place, and time. She has normal reflexes. No cranial nerve deficit. She exhibits normal muscle tone. Coordination normal.  Skin: Skin is warm and dry. No rash noted. She is not diaphoretic. No erythema. No pallor.  Psychiatric: She has a normal mood and affect. Her behavior is normal. Judgment and thought content normal.    Filed Vitals:   04/17/14 1508  BP: 126/82  Pulse: 103  Height: 5' 7.5" (1.715 m)  Weight: 166 lb (75.297 kg)  SpO2: 98%         Assessment & Plan:  Symptoms of shortness of breath when lying flat likely   - due to paradoxical vocal cord dysfunction triggered  By unclear causes such as anxiety (cold sweaty hands)  and acid reflux  - doubt this is due to asthma, heart issues or diaphragm issues   PLAN  - flu shot 04/17/2014  - for now continue advair and prilosec  - refer neuro-rehab voice therapy sessions with Mr Garald Balding  - refer [facility] for 2nd opinion - hold off on pregnancy for now   Followup   - 3 months to report progress  > 50% of this > 25 min visit spent in face to face counseling (15 min visit converted to 25 min)

## 2014-04-17 NOTE — Patient Instructions (Addendum)
Symptoms of shortness of breath when lying flat likely   - due to paradoxical vocal cord dysfunction triggered  By unclear causes such as anxiety and acid reflux   PLAN  - flu shot 04/17/2014  - for now continue advair and prilosec  - refer neuro-rehab voice therapy sessions with Mr Garald Balding  - refer [facility] for 2nd opinion - hold off on pregnancy for now   Followup   - 3 months to report progress

## 2014-04-17 NOTE — Addendum Note (Signed)
Addended by: Maurice March on: 04/17/2014 04:57 PM   Modules accepted: Orders

## 2014-04-24 ENCOUNTER — Ambulatory Visit: Payer: 59 | Attending: Internal Medicine

## 2014-04-24 DIAGNOSIS — R498 Other voice and resonance disorders: Secondary | ICD-10-CM | POA: Insufficient documentation

## 2014-04-24 DIAGNOSIS — Z5189 Encounter for other specified aftercare: Secondary | ICD-10-CM | POA: Diagnosis present

## 2014-05-07 ENCOUNTER — Ambulatory Visit: Payer: 59 | Admitting: Speech Pathology

## 2014-05-07 DIAGNOSIS — Z5189 Encounter for other specified aftercare: Secondary | ICD-10-CM | POA: Diagnosis not present

## 2014-05-08 ENCOUNTER — Ambulatory Visit: Payer: 59

## 2014-05-08 DIAGNOSIS — Z5189 Encounter for other specified aftercare: Secondary | ICD-10-CM | POA: Diagnosis not present

## 2014-05-11 ENCOUNTER — Ambulatory Visit (INDEPENDENT_AMBULATORY_CARE_PROVIDER_SITE_OTHER): Payer: 59 | Admitting: Family Medicine

## 2014-05-11 ENCOUNTER — Ambulatory Visit (HOSPITAL_COMMUNITY)
Admission: RE | Admit: 2014-05-11 | Discharge: 2014-05-11 | Disposition: A | Discharge status: Home / Self Care | Payer: 59 | Source: Ambulatory Visit | Attending: Family Medicine | Admitting: Family Medicine

## 2014-05-11 ENCOUNTER — Encounter: Payer: Self-pay | Admitting: Family Medicine

## 2014-05-11 VITALS — BP 100/70 | HR 60 | Temp 98.3°F | Ht 66.75 in | Wt 167.0 lb

## 2014-05-11 DIAGNOSIS — M7989 Other specified soft tissue disorders: Secondary | ICD-10-CM | POA: Insufficient documentation

## 2014-05-11 DIAGNOSIS — M79642 Pain in left hand: Secondary | ICD-10-CM | POA: Insufficient documentation

## 2014-05-11 NOTE — Patient Instructions (Signed)
-  go get the xray  -We placed a referral for you as discussed to the sports medicine doctor. It usually takes about 1-2 weeks to process and schedule this referral. If you have not heard from [place] regarding this appointment in 2 weeks please contact our office.

## 2014-05-11 NOTE — Progress Notes (Signed)
Pre visit review using our clinic review tool, if applicable. No additional management support is needed unless otherwise documented below in the visit note. 

## 2014-05-11 NOTE — Progress Notes (Signed)
No chief complaint on file.   HPI:  Acute visit for:  L hand pain: -started a few weeks ago -pain in pulm and feels like lump here, stiff - feels like can't quite open this had all the wat -worse in the morning -denies: weakness, tingling  Wheezing, Orthopnea, Asthma: -reviewed recent pulm notes  ROS: See pertinent positives and negatives per HPI.  Past Medical History  Diagnosis Date  . Asthma   . H/O varicella   . H/O candidiasis   . H/O cystitis   . Abnormal Pap smear     Colposcopy   . Preterm labor     with 1st pregnancy  . Hemorrhoids 01/29/2012  . LGSIL (low grade squamous intraepithelial dysplasia) 11/02/2011    Last pap WNL   . Obesity 11/17/2011  . Pedal edema 12/21/2011    Past Surgical History  Procedure Laterality Date  . Dilation and curettage of uterus  11/2010  . Dilation and evacuation  02/25/2011    Procedure: DILATATION AND EVACUATION (D&E);  Surgeon: Betsy Coder, MD;  Location: [facility];  Service: Gynecology;  Laterality: N/A;  with chromosome studies    Family History  Problem Relation Age of Onset  . Anesthesia problems Neg Hx   . Malignant hyperthermia Neg Hx   . Hypotension Neg Hx   . Pseudochol deficiency Neg Hx   . Diabetes Sister     type II  . Heart disease Maternal Grandmother     MI    History   Social History  . Marital Status: Married    Spouse Name: N/A    Number of Children: N/A  . Years of Education: N/A   Social History Main Topics  . Smoking status: Never Smoker   . Smokeless tobacco: Never Used  . Alcohol Use: No  . Drug Use: No  . Sexual Activity: None     Comment: Mirena inserted 03/22/12   Other Topics Concern  . 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[employer] Situation: lives with husband and 2 boy (16 and 2 yo in 2015)      Spiritual Beliefs: Christian      Lifestyle: regular CV exercise - 30-40 minutes of zumba daily; diet is  good             Current outpatient prescriptions:albuterol (PROVENTIL HFA;VENTOLIN HFA) 108 (90 BASE) MCG/ACT inhaler, Inhale 2 puffs into the lungs every 6 (six) hours as needed for wheezing., Disp: 1 Inhaler, Rfl: 4;  Fluticasone-Salmeterol (ADVAIR DISKUS) 100-50 MCG/DOSE AEPB, Inhale 1 puff into the lungs 2 (two) times daily., Disp: 60 each, Rfl: 5;  isotretinoin (ACCUTANE) 10 MG capsule, Take 40 mg by mouth daily., Disp: , Rfl:  levonorgestrel (MIRENA) 20 MCG/24HR IUD, 1 each by Intrauterine route once., Disp: , Rfl: ;  omeprazole (PRILOSEC) 20 MG capsule, Take 1 capsule (20 mg total) by mouth daily., Disp: 30 capsule, Rfl: 5  EXAM:  Filed Vitals:   05/11/14 1520  BP: 100/70  Pulse: 60  Temp: 98.3 F (36.8 C)    Body mass index is 26.37 kg/(m^2).  GENERAL: vitals reviewed and listed above, alert, oriented, appears well hydrated and in no acute distress  HEENT: atraumatic, conjunttiva clear, no obvious abnormalities on inspection of external nose and ears  NECK: no obvious masses on inspection  MS: moves all extremities without noticeable abnormality; L hand with irr density to  tissue in mid paalmar region of area of 4th digit flexors with significant TTP, some clicking with ext/flex of fingers and decreased passive and active extension of 3-5th digits, cap refill normal, normal sensation and strength  PSYCH: pleasant and cooperative, no obvious depression or anxiety  ASSESSMENT AND PLAN:  Discussed the following assessment and plan:  Left hand pain - Plan: Ambulatory referral to Sports Medicine, DG Hand Complete Left  -we discussed possible serious and likely etiologies, workup and treatment, treatment risks and return precautions - query cystic or abnormal process of flexor tendons though not typical findings for tenosynovitis, doubt vascular lesion -after this discussion, Annalysa opted for xray, referral to Charlann Boxer in sports medication for [place] evaluation -of course, we  advised Dunia  to return or notify a doctor immediately if symptoms worsen or persist or new concerns arise.   -Patient advised to return or notify a doctor immediately if symptoms worsen or persist or new concerns arise.  Patient Instructions  -go get the xray  -We placed a referral for you as discussed to the sports medicine doctor. It usually takes about 1-2 weeks to process and schedule this referral. If you have not heard from [place] regarding this appointment in 2 weeks please contact our office.      Colin Benton R.

## 2014-05-12 ENCOUNTER — Ambulatory Visit: Payer: 59

## 2014-05-12 DIAGNOSIS — Z5189 Encounter for other specified aftercare: Secondary | ICD-10-CM | POA: Diagnosis not present

## 2014-05-19 ENCOUNTER — Ambulatory Visit: Payer: 59

## 2014-05-19 DIAGNOSIS — Z5189 Encounter for other specified aftercare: Secondary | ICD-10-CM | POA: Diagnosis not present

## 2014-05-20 ENCOUNTER — Ambulatory Visit: Payer: 59 | Admitting: Family Medicine

## 2014-05-20 DIAGNOSIS — Z0289 Encounter for other administrative examinations: Secondary | ICD-10-CM

## 2014-05-25 ENCOUNTER — Encounter: Payer: Self-pay | Admitting: Family Medicine

## 2014-08-06 ENCOUNTER — Telehealth: Payer: Self-pay | Admitting: Internal Medicine

## 2014-08-06 DIAGNOSIS — J45909 Unspecified asthma, uncomplicated: Secondary | ICD-10-CM | POA: Insufficient documentation

## 2014-08-06 DIAGNOSIS — R0609 Other forms of dyspnea: Secondary | ICD-10-CM | POA: Insufficient documentation

## 2014-08-06 DIAGNOSIS — J4521 Mild intermittent asthma with (acute) exacerbation: Secondary | ICD-10-CM

## 2014-08-06 NOTE — Telephone Encounter (Signed)
Spoke with pt, states that she has a second opinion at [facility] per MR, wants to know if she can have something scheduled with [facility] since that is closer to her.    MR are you ok with changing facilities for the second opinion?  Thanks!

## 2014-08-06 NOTE — Telephone Encounter (Signed)
Yes please send to [facility]  Dr. Brand Males, M.D., [facility].C.P Pulmonary and Critical Care Medicine Staff Physician [facility] Pager: [phone], If no answer or between  15:00h - 7:00h: call [phone]  08/06/2014 6:18 PM

## 2014-08-07 NOTE — Telephone Encounter (Signed)
Order has been placed for referral. Attempted to call pt, no answer and I could not leave a message.

## 2014-08-07 NOTE — Telephone Encounter (Signed)
Spoke with the pt and notified that the referral was sent  Nothing further needed

## 2014-08-07 NOTE — Telephone Encounter (Signed)
lmomtcb x1 for pt 

## 2014-10-15 DIAGNOSIS — J4530 Mild persistent asthma, uncomplicated: Secondary | ICD-10-CM | POA: Insufficient documentation

## 2014-10-15 DIAGNOSIS — J45990 Exercise induced bronchospasm: Secondary | ICD-10-CM | POA: Insufficient documentation

## 2014-10-15 DIAGNOSIS — J4520 Mild intermittent asthma, uncomplicated: Secondary | ICD-10-CM | POA: Insufficient documentation

## 2014-11-25 DIAGNOSIS — F444 Conversion disorder with motor symptom or deficit: Secondary | ICD-10-CM | POA: Insufficient documentation

## 2015-03-18 ENCOUNTER — Encounter: Payer: Self-pay | Admitting: Family Medicine

## 2015-03-18 ENCOUNTER — Ambulatory Visit (INDEPENDENT_AMBULATORY_CARE_PROVIDER_SITE_OTHER): Payer: 59 | Admitting: Family Medicine

## 2015-03-18 VITALS — BP 108/78 | HR 60 | Temp 98.3°F | Ht 66.5 in | Wt 174.2 lb

## 2015-03-18 DIAGNOSIS — E785 Hyperlipidemia, unspecified: Secondary | ICD-10-CM | POA: Diagnosis not present

## 2015-03-18 DIAGNOSIS — Z23 Encounter for immunization: Secondary | ICD-10-CM

## 2015-03-18 DIAGNOSIS — Z0000 Encounter for general adult medical examination without abnormal findings: Secondary | ICD-10-CM

## 2015-03-18 LAB — LIPID PANEL
CHOLESTEROL: 232 mg/dL — AB (ref 0–200)
HDL: 61.9 mg/dL (ref >39.00)
LDL Cholesterol: 150 mg/dL — ABNORMAL HIGH (ref 0–99)
NonHDL: 170.46
Total CHOL/HDL Ratio: 4
Triglycerides: 100 mg/dL (ref 0.0–149.0)
VLDL: 20 mg/dL (ref 0.0–40.0)

## 2015-03-18 NOTE — Progress Notes (Signed)
HPI:  Here for CPE:  -Concerns and/or follow up today:   HLD: -advised lifestyel recs, has not done due to time and heat  Asthma: -mild persisten asthma, vocal cord dysfunction -using advair every other day + another inhaler from a specialist at [facility] -smoke is trigger -has not needed albuterol in a long time  -Diet: variety of foods, balance and well rounded  -Exercise: no regular exercise  -Taking folic acid, vitamin D or calcium: no  -Diabetes and Dyslipidemia Screening: FASTING  -Hx of HTN: no  -Vaccines: flu vaccine  -pap history:  Done with gyn this year  -FDLMP: no periods - on mirena  -sexual activity: yes, female partner, no new partners  -wants STI testing (Hep C if born [birth year]): declined, already done  -FH breast, colon or ovarian ca: see FH Last mammogram: n/a Last colon cancer screening:n/a  Does breast exams with gyn  -Alcohol, Tobacco, drug use: see social history  Review of Systems - no fevers, unintentional weight loss, vision loss, hearing loss, chest pain, sob, hemoptysis, melena, hematochezia, hematuria, genital discharge, changing or concerning skin lesions, bleeding, bruising, loc, thoughts of self harm or SI  Past Medical History  Diagnosis Date  . Asthma   . H/O varicella   . H/O candidiasis   . H/O cystitis   . Abnormal Pap smear     Colposcopy   . Preterm labor     with 1st pregnancy  . Hemorrhoids 01/29/2012  . LGSIL (low grade squamous intraepithelial dysplasia) 11/02/2011    Last pap WNL   . Obesity 11/17/2011  . Pedal edema 12/21/2011    Past Surgical History  Procedure Laterality Date  . Dilation and curettage of uterus  11/2010  . Dilation and evacuation  02/25/2011    Procedure: DILATATION AND EVACUATION (D&E);  Surgeon: Betsy Coder, MD;  Location: [facility];  Service: Gynecology;  Laterality: N/A;  with chromosome studies    Family History  Problem Relation Age of Onset  . Anesthesia problems Neg Hx   . Malignant  hyperthermia Neg Hx   . Hypotension Neg Hx   . Pseudochol deficiency Neg Hx   . Diabetes Sister     type II  . Heart disease Maternal Grandmother     MI    Social History   Social History  . Marital Status: Married    Spouse Name: N/A  . Number of Children: N/A  . Years of Education: N/A   Social History Main Topics  . Smoking status: Never Smoker   . Smokeless tobacco: Never Used  . Alcohol Use: No  . Drug Use: No  . Sexual Activity: Not Asked     Comment: Mirena inserted 03/22/12   Other Topics Concern  . 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  Fluticasone-Salmeterol (ADVAIR DISKUS) 100-50 MCG/DOSE AEPB, Inhale 1 puff into the lungs 2 (two) times daily., Disp: 60 each, Rfl: 5 .  levonorgestrel (MIRENA) 20 MCG/24HR IUD, 1 each by Intrauterine route once., Disp: , Rfl:  .  albuterol (PROVENTIL HFA;VENTOLIN HFA)  108 (90 BASE) MCG/ACT inhaler, Inhale 2 puffs into the lungs every 6 (six) hours as needed for wheezing., Disp: 1 Inhaler, Rfl: 4  EXAM:  Filed Vitals:   03/18/15 0819  BP: 108/78  Pulse: 60  Temp: 98.3 F (36.8 C)    GENERAL: vitals reviewed and listed below, alert, oriented, appears well hydrated and in no acute distress  HEENT: head atraumatic, PERRLA, normal appearance of eyes, ears, nose and mouth. moist mucus membranes.  NECK: supple, no masses or lymphadenopathy  LUNGS: clear to auscultation bilaterally, no rales, rhonchi or wheeze  CV: HRRR, no peripheral edema or cyanosis, normal pedal pulses  BREAST: declined  ABDOMEN: bowel sounds normal, soft, non tender to palpation, no masses, no rebound or guarding  GU:   declined  RECTAL: refused  SKIN: no rash or abnormal lesions  MS: normal gait, moves all extremities normally  NEURO: CN II-XII grossly intact, normal muscle strength and sensation to light touch on extremities  PSYCH: normal affect, pleasant and cooperative  ASSESSMENT AND PLAN:  Discussed the following assessment and plan:  Visit for preventive health examination  Hyperlipemia - Plan: Lipid Panel  -Discussed and advised all [place] preventive services health task force level A and B recommendations for age, sex and risks.  -Advised at least 150 minutes of exercise per week and a healthy diet low in saturated fats and sweets and consisting of fresh fruits and vegetables, lean meats such as fish and white chicken and whole grains.  -FASTING labs, studies and vaccines per orders this encounter  Orders Placed This Encounter  Procedures  . Lipid Panel    Patient advised to return to clinic immediately if symptoms worsen or persist or new concerns.  Patient Instructions  BEFORE YOU LEAVE: -flu vaccine  Check immunization record and let us know about your Tdap (tetanus booster)  [phone] IU vit D3 daily; folic acid if planning to have more children  We recommend the following healthy lifestyle measures: - eat a healthy diet consisting of lots of vegetables, fruits, beans, nuts, seeds, healthy meats such as white chicken and fish and whole grains.  - avoid fried foods, fast food, processed foods, sodas, red meet and other fattening foods.  - get a least 150 minutes of aerobic exercise per week.      No Follow-up on file.  Colin Benton R.

## 2015-03-18 NOTE — Patient Instructions (Signed)
BEFORE YOU LEAVE: -flu vaccine  Check immunization record and let us know about your Tdap (tetanus booster)  [phone] IU vit D3 daily; folic acid if planning to have more children  We recommend the following healthy lifestyle measures: - eat a healthy diet consisting of lots of vegetables, fruits, beans, nuts, seeds, healthy meats such as white chicken and fish and whole grains.  - avoid fried foods, fast food, processed foods, sodas, red meet and other fattening foods.  - get a least 150 minutes of aerobic exercise per week.

## 2015-03-18 NOTE — Progress Notes (Signed)
Pre visit review using our clinic review tool, if applicable. No additional management support is needed unless otherwise documented below in the visit note. 

## 2015-03-25 ENCOUNTER — Encounter: Payer: Self-pay | Admitting: Family Medicine

## 2015-11-15 LAB — LIPID PANEL
Cholesterol: 251 mg/dL — AB (ref 0–200)
HDL: 67 mg/dL (ref 35–70)

## 2015-11-16 ENCOUNTER — Telehealth: Payer: Self-pay | Admitting: Family Medicine

## 2015-11-16 NOTE — Telephone Encounter (Signed)
Pt had blood draw at work and cholest was still elevated 251. She is asking what is the next step

## 2015-11-16 NOTE — Telephone Encounter (Signed)
I called the pt and informed her of the message below and she was scheduled for an appt on 4/28.  She will bring a copy of the results with her to the appt.

## 2015-11-16 NOTE — Telephone Encounter (Signed)
This is higher than it was here in the past. . Advise appointment to discuss. Advise that she bring the lab results with her. Thanks.

## 2015-11-19 ENCOUNTER — Ambulatory Visit (INDEPENDENT_AMBULATORY_CARE_PROVIDER_SITE_OTHER): Payer: 59 | Admitting: Family Medicine

## 2015-11-19 ENCOUNTER — Encounter: Payer: Self-pay | Admitting: Family Medicine

## 2015-11-19 VITALS — BP 120/62 | HR 70 | Temp 98.5°F | Ht 66.5 in | Wt 198.2 lb

## 2015-11-19 DIAGNOSIS — Z634 Disappearance and death of family member: Secondary | ICD-10-CM | POA: Diagnosis not present

## 2015-11-19 DIAGNOSIS — E669 Obesity, unspecified: Secondary | ICD-10-CM

## 2015-11-19 DIAGNOSIS — E785 Hyperlipidemia, unspecified: Secondary | ICD-10-CM | POA: Diagnosis not present

## 2015-11-19 NOTE — Progress Notes (Signed)
HPI:  Acute visit for:  Elevated cholesterol/Obesity: -hx poor compliance with follow up regarding this in the past -reports unfortunately her father passed away from a stroke suddenly in January 2017 and she has not been taking care of herself  -labs at work showed an elevated total cholesterol of 251 and an HDL of 67 -diet and exercise:She has not been exercising until the last 1 week when she started walking; she admits to poor diet with lots of fast food  Bereavement: -Father passed January 2017 suddenly from stroke -She feels like she is finally starting to get her life back together and can focus on herself -Sad, but hasn't been coping well and has good support  ROS: See pertinent positives and negatives per HPI.  Past Medical History  Diagnosis Date  . Asthma   . H/O varicella   . H/O candidiasis   . H/O cystitis   . Abnormal Pap smear     Colposcopy   . Preterm labor     with 1st pregnancy  . Hemorrhoids 01/29/2012  . LGSIL (low grade squamous intraepithelial dysplasia) 11/02/2011    Last pap WNL   . Obesity 11/17/2011  . Pedal edema 12/21/2011    Past Surgical History  Procedure Laterality Date  . Dilation and curettage of uterus  11/2010  . Dilation and evacuation  02/25/2011    Procedure: DILATATION AND EVACUATION (D&E);  Surgeon: Betsy Coder, MD;  Location: [facility];  Service: Gynecology;  Laterality: N/A;  with chromosome studies    Family History  Problem Relation Age of Onset  . Anesthesia problems Neg Hx   . Malignant hyperthermia Neg Hx   . Hypotension Neg Hx   . Pseudochol deficiency Neg Hx   . Diabetes Sister     type II  . Heart disease Maternal Grandmother     MI    Social History   Social History  . Marital Status: Married    Spouse Name: N/A  . Number of Children: N/A  . Years of Education: N/A   Social History Main Topics  . Smoking status: Never Smoker   . Smokeless tobacco: Never Used  . Alcohol Use: No  . Drug Use: No  . Sexual  Activity: Not on file     Comment: Mirena inserted 03/22/12   Other Topics Concern  . 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  Fluticasone-Salmeterol (ADVAIR DISKUS) 100-50 MCG/DOSE AEPB, Inhale 1 puff into the lungs 2 (two) times daily., Disp: 60 each, Rfl: 5 .  levonorgestrel (MIRENA) 20 MCG/24HR IUD, 1 each by Intrauterine route once., Disp: , Rfl:  .  albuterol (PROVENTIL HFA;VENTOLIN HFA) 108 (90 BASE) MCG/ACT inhaler, Inhale 2 puffs into the lungs every 6 (six) hours as needed for wheezing., Disp: 1 Inhaler, Rfl: 4  EXAM:  Filed Vitals:   11/19/15 1129  BP: 120/62  Pulse: 70  Temp: 98.5 F (36.9 C)    Body mass index is 31.51 kg/(m^2).  GENERAL: vitals reviewed and listed above, alert, oriented, appears well hydrated and  in no acute distress  HEENT: atraumatic, conjunttiva clear, no obvious abnormalities on inspection of external nose and ears  NECK: no obvious masses on inspection  LUNGS: clear to auscultation bilaterally, no wheezes, rales or rhonchi, good air movement  CV: HRRR, no peripheral edema  MS: moves all extremities without noticeable abnormality  PSYCH: pleasant and cooperative, no obvious depression or anxiety  ASSESSMENT AND PLAN:  Discussed the following assessment and plan:  Obesity  Hyperlipidemia  Bereavement  -Supported on loss, seems to be doing well despite the situation -Lifestyle recs discussed at length, advised healthy plate, healthy choices and small portions -Advised regular aerobic exercise per American Heart Association guidelines -Discussed other treatment options for cholesterol and she does not want to  take a medication -Advised recheck a fasting cholesterol in about 3 months with follow-up after -Patient advised to return or notify a doctor immediately if symptoms worsen or persist or new concerns arise.  Patient Instructions   Before you leave: - Schedule follow-up in 3 months;  Plan to come fasting so that we can check labs or schedule lab appointment prior to that visit for a cholesterol panel  We recommend the following healthy lifestyle measures: - eat a healthy whole foods diet consisting of regular small meals composed of vegetables, fruits, beans, nuts, seeds, healthy meats such as white chicken and fish and whole grains.  - avoid sweets, white starchy foods, fried foods, fast food, processed foods, sodas, red meet and other fattening foods.  - get a least 150-300 minutes of aerobic exercise per week.       Colin Benton R.

## 2015-11-19 NOTE — Progress Notes (Signed)
Pre visit review using our clinic review tool, if applicable. No additional management support is needed unless otherwise documented below in the visit note. 

## 2015-11-19 NOTE — Patient Instructions (Signed)
Before you leave: - Schedule follow-up in 3 months;  Plan to come fasting so that we can check labs or schedule lab appointment prior to that visit for a cholesterol panel  We recommend the following healthy lifestyle measures: - eat a healthy whole foods diet consisting of regular small meals composed of vegetables, fruits, beans, nuts, seeds, healthy meats such as white chicken and fish and whole grains.  - avoid sweets, white starchy foods, fried foods, fast food, processed foods, sodas, red meet and other fattening foods.  - get a least 150-300 minutes of aerobic exercise per week.

## 2015-11-22 ENCOUNTER — Encounter: Payer: Self-pay | Admitting: Internal Medicine

## 2015-11-22 ENCOUNTER — Ambulatory Visit (INDEPENDENT_AMBULATORY_CARE_PROVIDER_SITE_OTHER): Payer: 59 | Admitting: Internal Medicine

## 2015-11-22 VITALS — BP 144/92 | HR 70 | Ht 67.0 in | Wt 199.4 lb

## 2015-11-22 DIAGNOSIS — R0689 Other abnormalities of breathing: Secondary | ICD-10-CM | POA: Diagnosis not present

## 2015-11-22 DIAGNOSIS — R0600 Dyspnea, unspecified: Secondary | ICD-10-CM

## 2015-11-22 LAB — NITRIC OXIDE: NITRIC OXIDE: 29

## 2015-11-22 NOTE — Patient Instructions (Addendum)
ICD-9-CM ICD-10-CM   1. Dyspnea and respiratory abnormality 786.09 R06.00     R06.89    Unclear what is causing symptoms  Plan - start pulmicort inhaler 2 puff twice daily through spring (for presumptive asthma worsened by spring)  followup  6 months   - feno at followup

## 2015-11-22 NOTE — Addendum Note (Signed)
Addended by: Collier Salina on: 11/22/2015 05:40 PM   Modules accepted: Orders

## 2015-11-22 NOTE — Progress Notes (Signed)
Subjective:     Patient ID: [name], female   DOB: April 21, 1981, 35 y.o.   MRN: [id number]  HPI    OV 04/17/2014  Chief Complaint  Patient presents with  . Follow-up    Pt here after echo. Pt c/o orthopnea and is down to using 3 pillows and is unable to lessen. Pt states she is using her albuterol before working out. Pt denies CP/tightness.     OV 03/17/2014  Chief Complaint  Patient presents with  . Follow-up    Pt states that she is just gettin over a cold. Pt is c/o orthopnea, pt using 4 pillows at night. Pt c/o DOE, mild dry cough. Pt denies CP/tightness. Pt last seen in 6/743.    35 year old female African American for dyspnea and asthma evaluation  She reports a history of childhood asthma diagnosed at age 87 when she was admitted for the same. After that she outgrew her asthma and was asymptomatic even to her first pregnancy in 2005. Than in the summer of 2013 during her second pregnancy she reports that her asthma flared up and she was extremely symptomatic with shortness of breath. At that time she did have pulmonary evaluation. She was placed on scheduled Advair which did help during that time per hx  Echocardiogram 01/03/2012 showed a left ventricular ejection fraction of 55% (which to me now is some low for a pregnancy state). Chart review shows inductions of labor at 37 weeks due to asthma. After that she did not followup with pulmonary. She reports that ever since her delivery she has always been orthopneic needing 4 pillows to sleep at night. Other than that she's had 4 episodes of respiratory upper infection during which time she develops significant worsening of shortness of breath but also developed cough and wheezing. The symptoms have been improved by albuterol for rescue and Advair. In between these episodes she has been fairly asymptomatic other than her baseline orthopnea and also dyspnea for exercise. This exercise-related dyspnea is improved by pre-treatment with  albuterol.   Due to persistence of symptoms and concern of severe asthma she has been referred here. She denies any paroxysmal nocturnal dyspnea, edema, chest pain  Spirometry the office today: she did 5 attempts, accepted only 2 - numbers are normal (personally reviewed). She had lot of cough    OV 04/17/2014  Chief Complaint  Patient presents with  . Follow-up    Pt here after echo. Pt c/o orthopnea and is down to using 3 pillows and is unable to lessen. Pt states she is using her albuterol before working out. Pt denies CP/tightness.     Followup orthopnea in the setting of medical history of asthma when she was pregnant   - She is here following up for tests as above echocardiogram which was normal. She's completed a therapeutic trial of both Prilosec and Advair compliantly according to the history. She still continues these medications. Despite these she continues to be significantly orthopneic. She says she is otherwise well and exercises but when she lies flat she is very dyspneic and wheezes. She is now using 3 pillows to sleep. She is extremely what about her condition. She and her husband are planning to have a third child and she is worried how this might affect her inability to get pregnant again  - There are no other new issues  - I made her lie flat i the bed and was in 15 seconds she got very dyspneic. I could auscultate  wheeze mostly coming from the throat but it was mild. She was tachycardic with a heart rate of 120. Pulse ox was 94% and did not desaturate. There was no paradoxical abdominal excursions. Within 45 seconds to 1 minute she got up saying she could not lie flat anymore. The wheezing seemed to resolve and she was sitting upright. The palms were cold and moist during this time   OV 11/22/2015  Chief Complaint  Patient presents with  . Follow-up    Doing yard work and both arms broke out in a rash, SOB, took son's albuterol neb and it helped.  Pt has felt better  today, but not better.     Follow-up dyspnea not otherwise specified and orthopnea that is idiopathic/related to vocal cord dysfunction  I personally last saw her September 2015. At that point referred her to [facility]. Review of outside chart shows that she actually ended up at [facility] She saw pulmonary initially in March 2016. Review of the records show she had a sniff test that was normal. Pulmonary function test was normal. They referred her to ENT where she saw Dr. Wanita Chamberlain May 2016. Reviewed his note and she's been diagnosed with an essentially normal laryngeal exam with functional and profound muscle tension dysphonia. The basis of orthopnea is been deemed his anxiety. She was discharged from follow-up.  Apparently since then she's been doing relatively well. At baseline she uses Advair as needed. She uses Advair maybe once or twice a week. Most recently 3 days ago 11/20/2015 she was sitting outside in the yard and all of a sudden she noticed an eruption of rash that was dry in her forearm region and elbow region. She did see the dermatologist today and has been prescribed topical steroid spray. Since then she's also been more short of breath and wheezing. She's used mixture of albuterol, Advair, Pulmicort as needed and it is given temporary relief. She's feeling a little bit better today but still worse than baseline terms of respiratory symptoms. There is no fever. She was not gardening and no exposure to poison ivy. Off note she says that only in the spring season she gets symptomatic. She does not use any baseline maintenance inhaled steroid.  Exhaled nitric oxide today - 29 ppb and   Social history: She works as a Marine scientist at [employer]    has a past medical history of Asthma; H/O varicella; H/O candidiasis; H/O cystitis; Abnormal Pap smear; Preterm labor; Hemorrhoids (01/29/2012); LGSIL (low grade squamous intraepithelial dysplasia) (11/02/2011); Obesity  (11/17/2011); and Pedal edema (12/21/2011).   reports that she has never smoked. She has never used smokeless tobacco.  Past Surgical History  Procedure Laterality Date  . Dilation and curettage of uterus  11/2010  . Dilation and evacuation  02/25/2011    Procedure: DILATATION AND EVACUATION (D&E);  Surgeon: Betsy Coder, MD;  Location: [facility];  Service: Gynecology;  Laterality: N/A;  with chromosome studies    Allergies  Allergen Reactions  . Ortho Evra [Norelgestromin-Eth Estradiol] Hives    Immunization History  Administered Date(s) Administered  . Influenza Split 04/24/2011, 04/23/2013  . Influenza,inj,Quad PF,36+ Mos 04/17/2014, 03/18/2015    Family History  Problem Relation Age of Onset  . Anesthesia problems Neg Hx   . Malignant hyperthermia Neg Hx   . Hypotension Neg Hx   . Pseudochol deficiency Neg Hx   . Diabetes Sister     type II  . Heart disease Maternal Grandmother  MI     Current outpatient prescriptions:  .  albuterol (PROVENTIL HFA;VENTOLIN HFA) 108 (90 BASE) MCG/ACT inhaler, Inhale 2 puffs into the lungs every 6 (six) hours as needed for wheezing., Disp: 1 Inhaler, Rfl: 4 .  Fluticasone-Salmeterol (ADVAIR DISKUS) 100-50 MCG/DOSE AEPB, Inhale 1 puff into the lungs 2 (two) times daily., Disp: 60 each, Rfl: 5 .  levonorgestrel (MIRENA) 20 MCG/24HR IUD, 1 each by Intrauterine route once., Disp: , Rfl:     Review of Systems     Objective:   Physical Exam  Constitutional: She is oriented to person, place, and time. She appears well-developed and well-nourished. No distress.  HENT:  Head: Normocephalic and atraumatic.  Right Ear: External ear normal.  Left Ear: External ear normal.  Mouth/Throat: Oropharynx is clear and moist. No oropharyngeal exudate.  Eyes: Conjunctivae and EOM are normal. Pupils are equal, round, and reactive to light. Right eye exhibits no discharge. Left eye exhibits no discharge. No scleral icterus.  Neck: Normal range of  motion. Neck supple. No JVD present. No tracheal deviation present. No thyromegaly present.  Cardiovascular: Normal rate, regular rhythm, normal heart sounds and intact distal pulses.  Exam reveals no gallop and no friction rub.   No murmur heard. Pulmonary/Chest: Effort normal and breath sounds normal. No respiratory distress. She has no wheezes. She has no rales. She exhibits no tenderness.  Abdominal: Soft. Bowel sounds are normal. She exhibits no distension and no mass. There is no tenderness. There is no rebound and no guarding.  Musculoskeletal: Normal range of motion. She exhibits no edema or tenderness.  Lymphadenopathy:    She has no cervical adenopathy.  Neurological: She is alert and oriented to person, place, and time. She has normal reflexes. No cranial nerve deficit. She exhibits normal muscle tone. Coordination normal.  Skin: Skin is warm and dry. No rash noted. She is not diaphoretic. No erythema. No pallor.  Psychiatric: She has a normal mood and affect. Her behavior is normal. Judgment and thought content normal.  Vitals reviewed.  Filed Vitals:   11/22/15 1650  BP: 144/92  Pulse: 70  Height: 5\' 7"  (1.702 m)  Weight: 199 lb 6.4 oz (90.447 kg)  SpO2: 100%          Assessment:       ICD-9-CM ICD-10-CM   1. Dyspnea and respiratory abnormality 786.09 R06.00     R06.89        Plan:      Unclear what is causing symptoms. FENO is borderline. So, I think is VCD +/- mild asthma. Offered prednisone burst but she refused  Plan - start pulmicort inhaler 2 puff twice daily through spring (for presumptive asthma worsened by spring)  followup  6 months   - feno at followup      Dr. Brand Males, M.D., [facility].C.P Pulmonary and Critical Care Medicine Staff Physician [facility] Pager: [phone], If no answer or between  15:00h - 7:00h: call [phone]  11/22/2015 5:18 PM

## 2015-12-09 ENCOUNTER — Encounter: Payer: Self-pay | Admitting: Family Medicine

## 2015-12-16 MED FILL — PHENTERMINE 37.5 MG TABLET: 37.5 | 30 days supply | Qty: 30 | Fill #0

## 2016-02-25 ENCOUNTER — Ambulatory Visit: Payer: 59 | Admitting: Family Medicine

## 2016-04-17 MED FILL — PHENTERMINE 37.5 MG TABLET: 37.5 | 30 days supply | Qty: 30 | Fill #1

## 2016-06-01 ENCOUNTER — Encounter: Payer: Self-pay | Admitting: Family Medicine

## 2016-06-26 MED FILL — LEVONOR-ETH ESTRAD 0.1-0.02: 0.1-20 | 28 days supply | Qty: 28 | Fill #0

## 2016-06-28 MED FILL — ONDANSETRON HCL 4 MG TABLET: 4 | 6 days supply | Qty: 12 | Fill #0

## 2016-07-12 ENCOUNTER — Encounter: Payer: Self-pay | Admitting: Family Medicine

## 2016-07-13 ENCOUNTER — Ambulatory Visit (INDEPENDENT_AMBULATORY_CARE_PROVIDER_SITE_OTHER)
Admission: RE | Admit: 2016-07-13 | Discharge: 2016-07-13 | Disposition: A | Discharge status: Home / Self Care | Payer: 59 | Source: Ambulatory Visit | Attending: Internal Medicine | Admitting: Internal Medicine

## 2016-07-13 ENCOUNTER — Encounter: Payer: Self-pay | Admitting: Internal Medicine

## 2016-07-13 ENCOUNTER — Ambulatory Visit (INDEPENDENT_AMBULATORY_CARE_PROVIDER_SITE_OTHER): Payer: 59 | Admitting: Internal Medicine

## 2016-07-13 VITALS — BP 118/80 | HR 72 | Temp 98.1°F | Resp 16 | Ht 67.0 in | Wt 187.0 lb

## 2016-07-13 DIAGNOSIS — R05 Cough: Secondary | ICD-10-CM | POA: Diagnosis not present

## 2016-07-13 DIAGNOSIS — R059 Cough, unspecified: Secondary | ICD-10-CM

## 2016-07-13 DIAGNOSIS — J988 Other specified respiratory disorders: Secondary | ICD-10-CM

## 2016-07-13 MED ORDER — SULFAMETHOXAZOLE-TRIMETHOPRIM 800-160 MG PO TABS: 1.0000 | ORAL_TABLET | Freq: Two times a day (BID) | ORAL | 0 refills | Status: DC

## 2016-07-13 MED ORDER — HYDROCODONE-HOMATROPINE 5-1.5 MG/5ML PO SYRP: 5.0000 mL | ORAL_SOLUTION | Freq: Three times a day (TID) | ORAL | 0 refills | Status: DC | PRN

## 2016-07-13 MED FILL — HYDROCODONE-HOMATROPINE SYR: 5-1.5 | 8 days supply | Qty: 120 | Fill #0

## 2016-07-13 MED FILL — SULFAMETHOXAZOLE/TMP DS TAB: 800-160 | 10 days supply | Qty: 20 | Fill #0

## 2016-07-13 NOTE — Progress Notes (Signed)
Pre visit review using our clinic review tool, if applicable. No additional management support is needed unless otherwise documented below in the visit note. 

## 2016-07-13 NOTE — Telephone Encounter (Signed)
LMTCB and also replied to pt to contact office for appt.

## 2016-07-13 NOTE — Patient Instructions (Signed)
Cough, Adult Coughing is a reflex that clears your throat and your airways. Coughing helps to heal and protect your lungs. It is normal to cough occasionally, but a cough that happens with other symptoms or lasts a long time may be a sign of a condition that needs treatment. A cough may last only 2-3 weeks (acute), or it may last longer than 8 weeks (chronic). What are the causes? Coughing is commonly caused by:  Breathing in substances that irritate your lungs.  A viral or bacterial respiratory infection.  Allergies.  Asthma.  Postnasal drip.  Smoking.  Acid backing up from the stomach into the esophagus (gastroesophageal reflux).  Certain medicines.  Chronic lung problems, including COPD (or rarely, lung cancer).  Other medical conditions such as heart failure.  Follow these instructions at home: Pay attention to any changes in your symptoms. Take these actions to help with your discomfort:  Take medicines only as told by your health care provider. ? If you were prescribed an antibiotic medicine, take it as told by your health care provider. Do not stop taking the antibiotic even if you start to feel better. ? Talk with your health care provider before you take a cough suppressant medicine.  Drink enough fluid to keep your urine clear or pale yellow.  If the air is dry, use a cold steam vaporizer or humidifier in your bedroom or your home to help loosen secretions.  Avoid anything that causes you to cough at work or at home.  If your cough is worse at night, try sleeping in a semi-upright position.  Avoid cigarette smoke. If you smoke, quit smoking. If you need help quitting, ask your health care provider.  Avoid caffeine.  Avoid alcohol.  Rest as needed.  Contact a health care provider if:  You have new symptoms.  You cough up pus.  Your cough does not get better after 2-3 weeks, or your cough gets worse.  You cannot control your cough with suppressant  medicines and you are losing sleep.  You develop pain that is getting worse or pain that is not controlled with pain medicines.  You have a fever.  You have unexplained weight loss.  You have night sweats. Get help right away if:  You cough up blood.  You have difficulty breathing.  Your heartbeat is very fast. This information is not intended to replace advice given to you by your health care provider. Make sure you discuss any questions you have with your health care provider. Document Released: 01/06/2011 Document Revised: 12/16/2015 Document Reviewed: 09/16/2014 Elsevier Interactive Patient Education  2017 Elsevier Inc.  

## 2016-07-13 NOTE — Progress Notes (Signed)
Subjective:  Patient ID: [name], female    DOB: 1981-04-04  Age: 35 y.o. MRN: [id number]  CC: Cough   HPI [name] presents for a 4 day history of cough that's been intermittently productive of thick, yellow/greenish brown phlegm that was occasionally blood tinged. She had a few episodes of wheezing that responded well to albuterol. She denies chest pain, night sweats, fever, chills. She just returned from a cruise.  Outpatient Medications Prior to Visit  Medication Sig Dispense Refill  . Fluticasone-Salmeterol (ADVAIR DISKUS) 100-50 MCG/DOSE AEPB Inhale 1 puff into the lungs 2 (two) times daily. 60 each 5  . levonorgestrel (MIRENA) 20 MCG/24HR IUD 1 each by Intrauterine route once.    Marland Kitchen albuterol (PROVENTIL HFA;VENTOLIN HFA) 108 (90 BASE) MCG/ACT inhaler Inhale 2 puffs into the lungs every 6 (six) hours as needed for wheezing. 1 Inhaler 4   No facility-administered medications prior to visit.     ROS Review of Systems  Constitutional: Negative for appetite change, chills, diaphoresis, fatigue and fever.  HENT: Positive for congestion and sore throat. Negative for facial swelling and trouble swallowing.   Respiratory: Positive for cough and wheezing. Negative for chest tightness, shortness of breath and stridor.   Cardiovascular: Negative for chest pain, palpitations and leg swelling.  Gastrointestinal: Negative.  Negative for abdominal pain, constipation, diarrhea, nausea and vomiting.  Endocrine: Negative.   Genitourinary: Negative.  Negative for difficulty urinating.  Musculoskeletal: Negative.  Negative for back pain and myalgias.  Skin: Negative.   Allergic/Immunologic: Negative.   Neurological: Negative.  Negative for dizziness, weakness, numbness and headaches.  Hematological: Negative.  Negative for adenopathy. Does not bruise/bleed easily.  Psychiatric/Behavioral: Negative.     Objective:  BP 118/80 (BP Location: Left Arm, Patient Position: Sitting,  Cuff Size: Normal)   Pulse 72   Temp 98.1 F (36.7 C) (Oral)   Resp 16   Ht 5\' 7"  (1.702 m)   Wt 187 lb (84.8 kg)   SpO2 96%   Breastfeeding? No   BMI 29.29 kg/m   BP Readings from Last 3 Encounters:  07/13/16 118/80  11/22/15 (!) 144/92  11/19/15 120/62    Wt Readings from Last 3 Encounters:  07/13/16 187 lb (84.8 kg)  11/22/15 199 lb 6.4 oz (90.4 kg)  11/19/15 198 lb 3.2 oz (89.9 kg)    Physical Exam  Constitutional: She is oriented to person, place, and time.  Non-toxic appearance. She does not have a sickly appearance. She does not appear ill. No distress.  HENT:  Mouth/Throat: Oropharynx is clear and moist. No oropharyngeal exudate.  Eyes: Conjunctivae are normal. Right eye exhibits no discharge. Left eye exhibits no discharge. No scleral icterus.  Neck: Normal range of motion. Neck supple. No JVD present. No tracheal deviation present. No thyromegaly present.  Cardiovascular: Normal rate, regular rhythm, normal heart sounds and intact distal pulses.  Exam reveals no gallop and no friction rub.   No murmur heard. Pulmonary/Chest: Effort normal and breath sounds normal. No stridor. No respiratory distress. She has no wheezes. She has no rales. She exhibits no tenderness.  Abdominal: Soft. Bowel sounds are normal. She exhibits no distension and no mass. There is no tenderness. There is no rebound and no guarding.  Musculoskeletal: Normal range of motion. She exhibits no edema, tenderness or deformity.  Lymphadenopathy:    She has no cervical adenopathy.  Neurological: She is oriented to person, place, and time.  Skin: Skin is warm and dry. No rash noted.  She is not diaphoretic. No erythema. No pallor.  Vitals reviewed.   Lab Results  Component Value Date   WBC 11.1 (H) 01/23/2012   HGB 9.7 (L) 01/23/2012   HCT 29.1 (L) 01/23/2012   PLT 285 01/23/2012   GLUCOSE 130 (H) 12/21/2011   CHOL 251 (A) 11/15/2015   TRIG 100.0 03/18/2015   HDL 67 11/15/2015   LDLCALC 150  (H) 03/18/2015   ALT 17 12/21/2011   AST 21 12/21/2011   NA 132 (L) 12/21/2011   K 3.4 (L) 12/21/2011   CL 100 12/21/2011   CREATININE 0.51 12/21/2011   BUN 6 12/21/2011   CO2 19 12/21/2011   TSH 0.883 05/10/2010   HGBA1C 5.5 04/01/2014    Dg Hand Complete Left  Result Date: 05/11/2014 CLINICAL DATA:  Acute left hand pain and swelling EXAM: LEFT HAND - COMPLETE 3+ VIEW COMPARISON:  None. FINDINGS: Radiopaque rings remain on the fourth finger. Normal alignment. No acute osseous finding or fracture. Preserved joint spaces. No arthropathy. No soft tissue abnormality or focal swelling. IMPRESSION: No acute finding. Electronically Signed   By: Daryll Brod M.D.   On: 05/11/2014 17:07   Dg Chest 2 View  Result Date: 07/13/2016 CLINICAL DATA:  Four days of cough. [place]. History of asthma nonsmoker. EXAM: CHEST  2 VIEW COMPARISON:  Frontal chest x-ray of Dec 22, 2011 FINDINGS: The lungs are adequately inflated. There is no focal infiltrate. Lung markings coarse in the right infrahilar region on the frontal view but this is stable. The heart and pulmonary vascularity are normal. The mediastinum is normal in width. There is gentle dextrocurvature centered in the mid to lower thoracic spine. IMPRESSION: There is no pneumonia nor other acute cardiopulmonary abnormality. Electronically Signed   By: David  Martinique M.D.   On: 07/13/2016 09:28     Assessment & Plan:   Tellie was seen today for cough.  Diagnoses and all orders for this visit:  Cough- her chest x-ray is negative for pneumonia. Will treat the cough with Hycodan. -     HYDROcodone-homatropine (HYCODAN) 5-1.5 MG/5ML syrup; Take 5 mLs by mouth every 8 (eight) hours as needed for cough. -     DG Chest 2 View; Future  RTI (respiratory tract infection)- will treat the infection with Bactrim DS. -     sulfamethoxazole-trimethoprim (BACTRIM DS,SEPTRA DS) 800-160 MG tablet; Take 1 tablet by mouth 2  (two) times daily. -     HYDROcodone-homatropine (HYCODAN) 5-1.5 MG/5ML syrup; Take 5 mLs by mouth every 8 (eight) hours as needed for cough.   I am having [name] start on sulfamethoxazole-trimethoprim and HYDROcodone-homatropine. I am also having her maintain her levonorgestrel, albuterol, Fluticasone-Salmeterol, and levonorgestrel-ethinyl estradiol.  Meds ordered this encounter  Medications  . levonorgestrel-ethinyl estradiol (AVIANE,ALESSE,LESSINA) 0.1-20 MG-MCG tablet  . sulfamethoxazole-trimethoprim (BACTRIM DS,SEPTRA DS) 800-160 MG tablet    Sig: Take 1 tablet by mouth 2 (two) times daily.    Dispense:  20 tablet    Refill:  0  . HYDROcodone-homatropine (HYCODAN) 5-1.5 MG/5ML syrup    Sig: Take 5 mLs by mouth every 8 (eight) hours as needed for cough.    Dispense:  120 mL    Refill:  0     Follow-up: No Follow-up on file.  Scarlette Calico, MD

## 2016-10-03 DIAGNOSIS — N760 Acute vaginitis: Secondary | ICD-10-CM | POA: Diagnosis not present

## 2016-10-03 MED FILL — metroNIDAZOLE 500 MG TABS: 500 | 7 days supply | Qty: 14 | Fill #0

## 2016-10-03 MED FILL — FLUCONAZOLE 150 MG TABLET: 150 | 1 days supply | Qty: 1 | Fill #0

## 2016-10-30 MED FILL — PHENTERMINE 37.5 MG TABLET: 37.5 | 30 days supply | Qty: 30 | Fill #0

## 2017-02-02 MED FILL — PHENTERMINE 37.5 MG TABLET: 37.5 | 30 days supply | Qty: 30 | Fill #1

## 2017-02-02 MED FILL — BETAMETHASONE DP 0.05% LOT: 0.05 | 20 days supply | Qty: 60 | Fill #0

## 2017-04-12 ENCOUNTER — Encounter: Payer: Self-pay | Admitting: Family Medicine

## 2017-04-12 MED FILL — PHENTERMINE 37.5 MG TABLET: 37.5 | 30 days supply | Qty: 30 | Fill #2

## 2017-04-17 MED FILL — FLUCONAZOLE 150 MG TABLET: 150 | 1 days supply | Qty: 1 | Fill #1

## 2017-05-03 DIAGNOSIS — Z124 Encounter for screening for malignant neoplasm of cervix: Secondary | ICD-10-CM | POA: Diagnosis not present

## 2017-05-03 DIAGNOSIS — Z01419 Encounter for gynecological examination (general) (routine) without abnormal findings: Secondary | ICD-10-CM | POA: Diagnosis not present

## 2017-05-03 LAB — HM PAP SMEAR

## 2017-05-07 ENCOUNTER — Encounter: Payer: Self-pay | Admitting: Family Medicine

## 2017-08-02 ENCOUNTER — Encounter: Payer: Self-pay | Admitting: Family Medicine

## 2017-09-04 DIAGNOSIS — N644 Mastodynia: Secondary | ICD-10-CM | POA: Diagnosis not present

## 2017-09-10 DIAGNOSIS — N644 Mastodynia: Secondary | ICD-10-CM | POA: Diagnosis not present

## 2017-09-24 MED FILL — FLUCONAZOLE 150 MG TABLET: 150 | 1 days supply | Qty: 1 | Fill #2

## 2017-10-22 MED FILL — PHENTERMINE 37.5 MG TABLET: 37.5 | 30 days supply | Qty: 30 | Fill #0

## 2017-12-06 ENCOUNTER — Encounter (HOSPITAL_BASED_OUTPATIENT_CLINIC_OR_DEPARTMENT_OTHER): Payer: Self-pay | Admitting: Obstetrics and Gynecology

## 2017-12-06 DIAGNOSIS — Z803 Family history of malignant neoplasm of breast: Secondary | ICD-10-CM | POA: Insufficient documentation

## 2018-01-22 IMAGING — DX DG CHEST 2V
2 series · 2 of 2 positions shown · non-contrast
Comparison: Frontal chest x-ray December 22, 2011

CLINICAL DATA: Four days of cough. Recently [place]. History of asthma nonsmoker.

EXAM:
CHEST  2 VIEW

[chest pa]
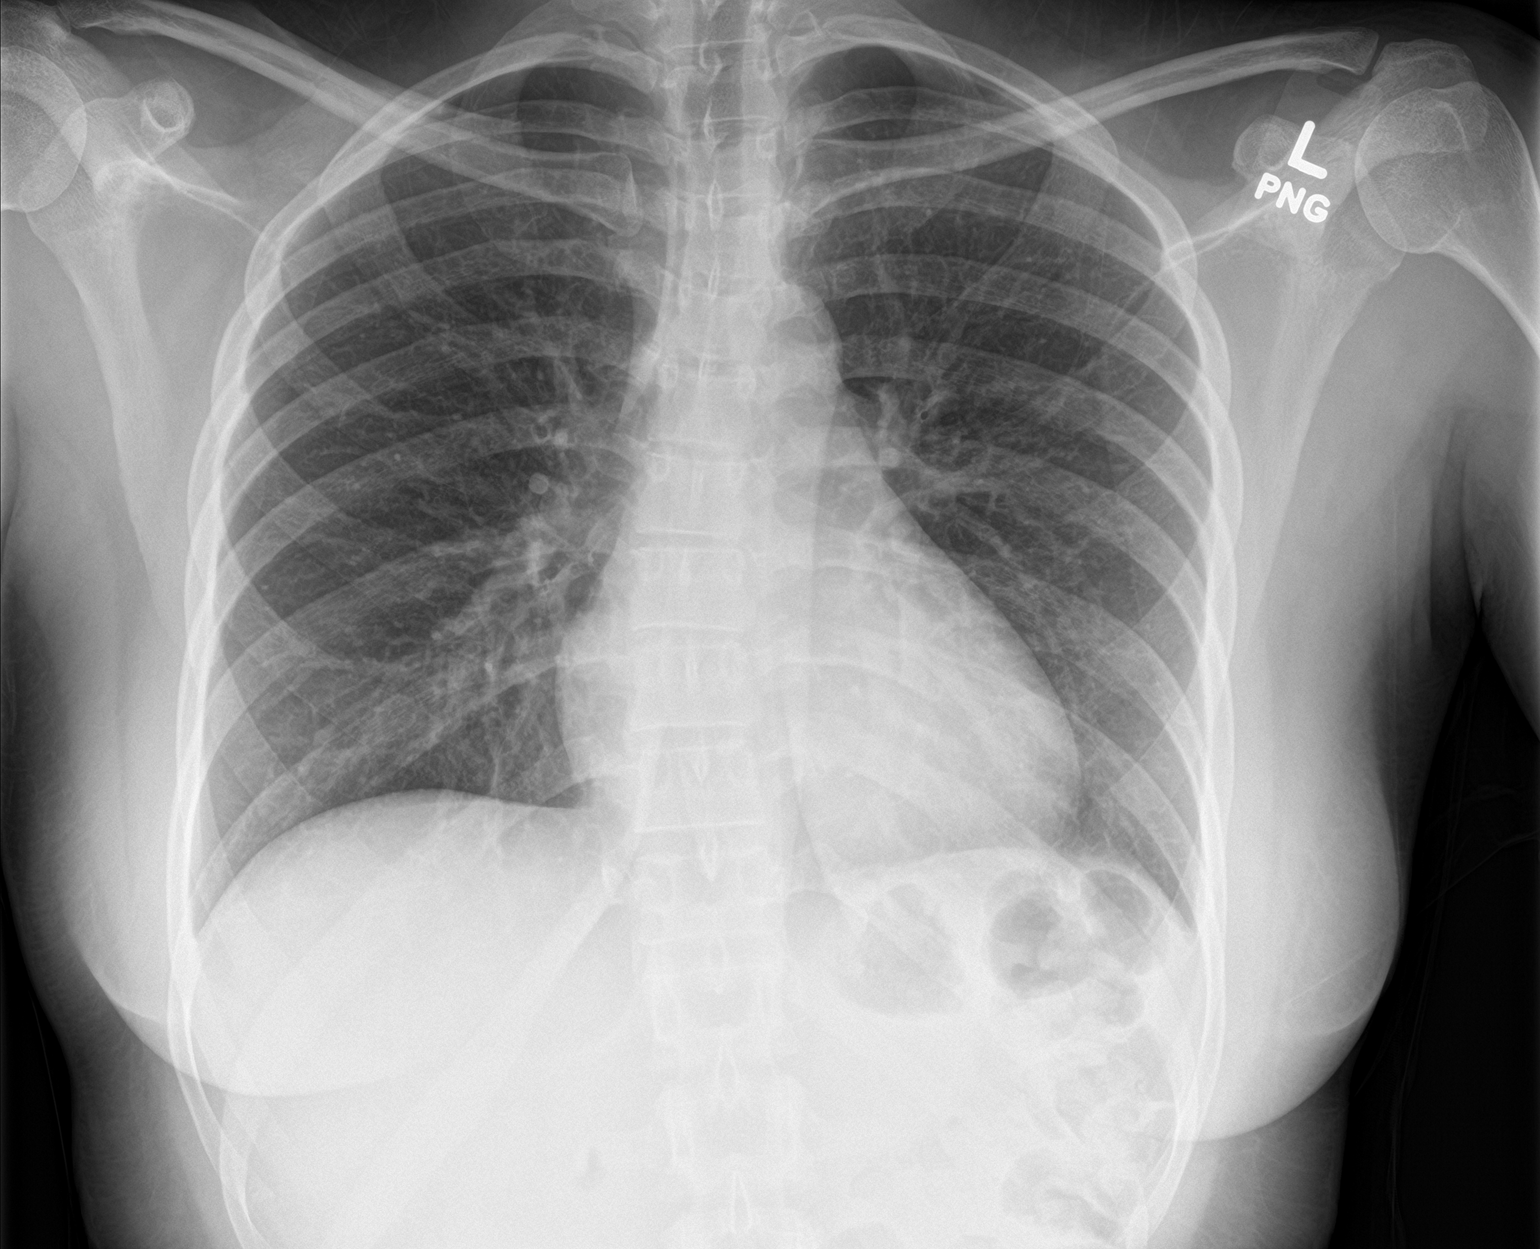

[chest lat]
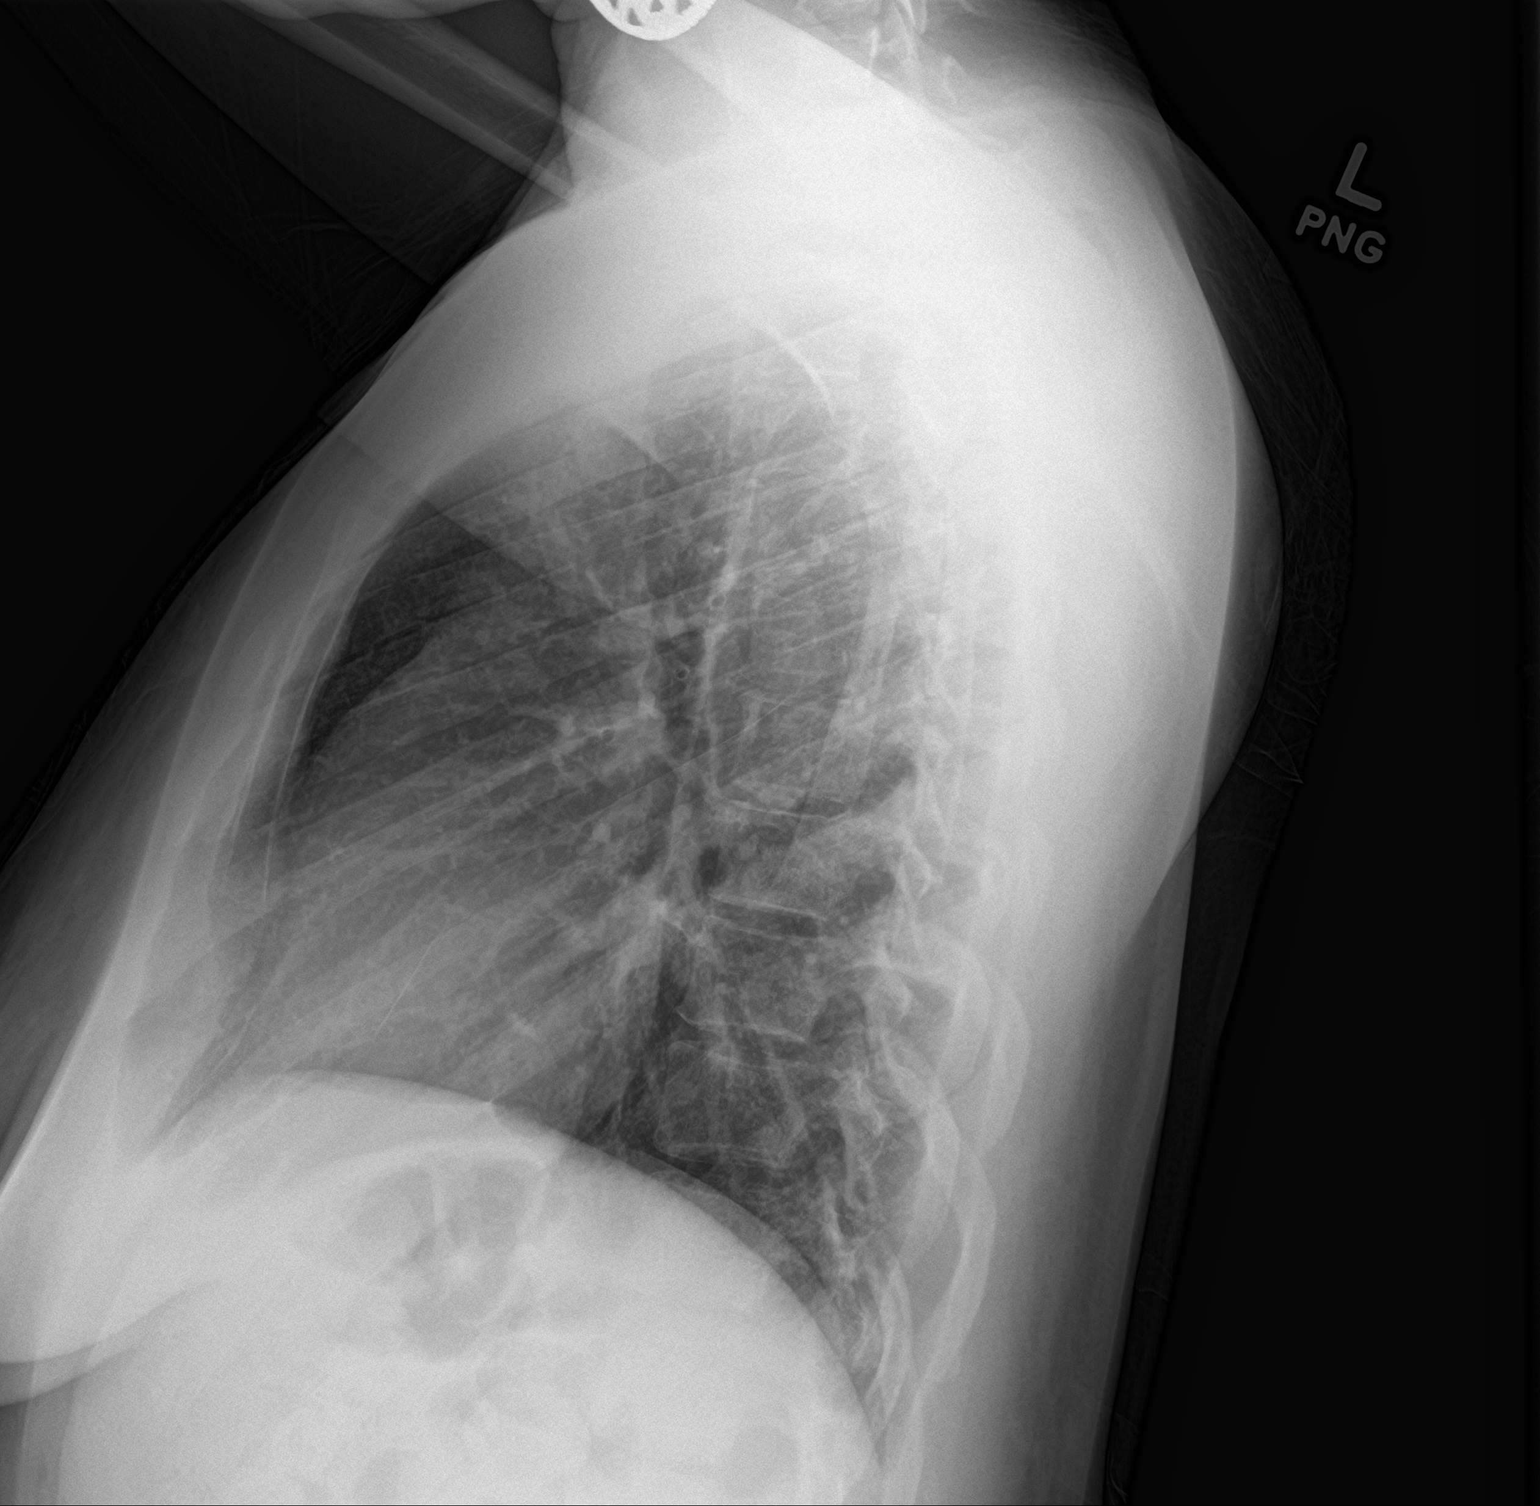

[2 of 2 positions shown; findings below may reference images not displayed]

FINDINGS: The lungs are adequately inflated. There is no focal infiltrate.
Lung markings coarse in the right infrahilar region on the frontal
view but this is stable. The heart and pulmonary vascularity are
normal. The mediastinum is normal in width. There is gentle
dextrocurvature centered in the mid to lower thoracic spine.
IMPRESSION: There is no pneumonia nor other acute cardiopulmonary abnormality.

## 2018-03-07 MED FILL — PHENTERMINE 37.5 MG TABLET: 37.5 | 30 days supply | Qty: 30 | Fill #1

## 2018-03-29 MED FILL — FLUCONAZOLE 150 MG TABS: 150 | 1 days supply | Qty: 1 | Fill #0

## 2018-04-24 DIAGNOSIS — J4520 Mild intermittent asthma, uncomplicated: Secondary | ICD-10-CM | POA: Diagnosis not present

## 2018-04-24 DIAGNOSIS — Z8349 Family history of other endocrine, nutritional and metabolic diseases: Secondary | ICD-10-CM | POA: Diagnosis not present

## 2018-04-24 DIAGNOSIS — Z6826 Body mass index (BMI) 26.0-26.9, adult: Secondary | ICD-10-CM | POA: Diagnosis not present

## 2018-04-24 DIAGNOSIS — R636 Underweight: Secondary | ICD-10-CM | POA: Diagnosis not present

## 2018-05-09 DIAGNOSIS — R87618 Other abnormal cytological findings on specimens from cervix uteri: Secondary | ICD-10-CM | POA: Diagnosis not present

## 2018-05-09 DIAGNOSIS — Z01419 Encounter for gynecological examination (general) (routine) without abnormal findings: Secondary | ICD-10-CM | POA: Diagnosis not present

## 2018-05-09 DIAGNOSIS — Z6825 Body mass index (BMI) 25.0-25.9, adult: Secondary | ICD-10-CM | POA: Diagnosis not present

## 2018-05-09 DIAGNOSIS — Z124 Encounter for screening for malignant neoplasm of cervix: Secondary | ICD-10-CM | POA: Diagnosis not present

## 2018-05-09 LAB — HM PAP SMEAR

## 2018-05-09 MED FILL — FLUCONAZOLE 150 MG TABS: 150 | 1 days supply | Qty: 1 | Fill #0

## 2018-05-09 MED FILL — SCOPOLAMINE 1 MG/3DAYS PT72: 1 | 3 days supply | Qty: 1 | Fill #0

## 2018-05-09 MED FILL — ONDANSETRON HCL 4 MG TABLET: 4 | 7 days supply | Qty: 30 | Fill #0

## 2018-05-17 ENCOUNTER — Telehealth: Payer: Self-pay | Admitting: Internal Medicine

## 2018-05-17 ENCOUNTER — Ambulatory Visit (INDEPENDENT_AMBULATORY_CARE_PROVIDER_SITE_OTHER): Payer: 59

## 2018-05-17 DIAGNOSIS — Z23 Encounter for immunization: Secondary | ICD-10-CM | POA: Diagnosis not present

## 2018-05-17 NOTE — Telephone Encounter (Signed)
Letter printed verifying that patient has had a flu vaccine. Nothing further is needed at this time.

## 2018-05-24 MED FILL — FLUCONAZOLE 150 MG TABS: 150 | 1 days supply | Qty: 1 | Fill #0

## 2018-07-10 ENCOUNTER — Encounter: Payer: Self-pay | Admitting: Family Medicine

## 2018-07-20 ENCOUNTER — Ambulatory Visit: Payer: 59 | Admitting: Primary Care

## 2018-07-20 VITALS — BP 122/90 | HR 77 | Temp 98.9°F | Wt 180.0 lb

## 2018-07-20 DIAGNOSIS — Z20818 Contact with and (suspected) exposure to other bacterial communicable diseases: Secondary | ICD-10-CM

## 2018-07-20 DIAGNOSIS — J029 Acute pharyngitis, unspecified: Secondary | ICD-10-CM

## 2018-07-20 LAB — POCT RAPID STREP A (OFFICE): Rapid Strep A Screen: NEGATIVE

## 2018-07-20 NOTE — Progress Notes (Signed)
Subjective:    Patient ID: [name], female    DOB: [birth year], 37 y.o.   MRN: [id number]  HPI  [name] is a 37 year old female with a history of moderate asthma managed on Advair who presents today with a chief complaint of sore throat.  Her symptoms began this morning when waking. Her son was diagnosed and treated for strep throat with antibiotics on 07/16/18. She denies fevers, body aches. She's used her albuterol inhaler this morning, doesn't use her Advair daily. She wants to ensure she's not been infected with strep.   Review of Systems  Constitutional: Negative for chills, fatigue and fever.  HENT: Positive for sore throat. Negative for congestion, ear pain and sinus pressure.   Respiratory: Positive for cough. Negative for shortness of breath.   Cardiovascular: Negative for chest pain.       Past Medical History:  Diagnosis Date  . Abnormal Pap smear    Colposcopy   . Asthma   . H/O candidiasis   . H/O cystitis   . H/O varicella   . Hemorrhoids 01/29/2012  . LGSIL (low grade squamous intraepithelial dysplasia) 11/02/2011   Last pap WNL   . Obesity 11/17/2011  . Pedal edema 12/21/2011  . Preterm labor    with 1st pregnancy     Social History   Socioeconomic History  . Marital status: Married    Spouse name: Not on file  . Number of children: Not on file  . Years of education: Not on file  . Highest education level: Not on file  Occupational History  . Not on file  Social Needs  . Financial resource strain: Not on file  . Food insecurity:    Worry: Not on file    Inability: Not on file  . Transportation needs:    Medical: Not on file    Non-medical: Not on file  Tobacco Use  . Smoking status: Never Smoker  . Smokeless tobacco: Never Used  Substance and Sexual Activity  . Alcohol use: No  . Drug use: No  . Sexual activity: Not on file    Comment: Mirena inserted 03/22/12  Lifestyle  . Physical activity:    Days per week: Not on file   Minutes per session: Not on file  . Stress: Not on file  Relationships  . Social connections:    Talks on phone: Not on file    Gets together: Not on file    Attends religious service: Not on file    Active member of club or organization: Not on file    Attends meetings of clubs or organizations: Not on file    Relationship status: Not on file  . Intimate partner violence:    Fear of current or ex partner: Not on file    Emotionally abused: Not on file    Physically abused: Not on file    Forced sexual activity: Not on file  Other Topics Concern  . 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 DILATION AND  CURETTAGE OF UTERUS  11/2010  . DILATION AND EVACUATION  02/25/2011   Procedure: DILATATION AND EVACUATION (D&E);  Surgeon: Betsy Coder, MD;  Location: [facility];  Service: Gynecology;  Laterality: N/A;  with chromosome studies    Family History  Problem Relation Age of Onset  . Diabetes Sister        type II  . Heart disease Maternal Grandmother        MI  . Anesthesia problems Neg Hx   . Malignant hyperthermia Neg Hx   . Hypotension Neg Hx   . Pseudochol deficiency Neg Hx     Allergies  Allergen Reactions  . Ortho Evra [Norelgestromin-Eth Estradiol] Hives    Current Outpatient Medications on File Prior to Visit  Medication Sig Dispense Refill  . albuterol (PROVENTIL HFA;VENTOLIN HFA) 108 (90 BASE) MCG/ACT inhaler Inhale 2 puffs into the lungs every 6 (six) hours as needed for wheezing. 1 Inhaler 4  . Fluticasone-Salmeterol (ADVAIR DISKUS) 100-50 MCG/DOSE AEPB Inhale 1 puff into the lungs 2 (two) times daily. 60 each 5  . levonorgestrel  (MIRENA) 20 MCG/24HR IUD 1 each by Intrauterine route once.     No current facility-administered medications on file prior to visit.     BP 122/90 (BP Location: Left Arm, Patient Position: Sitting, Cuff Size: Normal)   Pulse 77   Temp 98.9 F (37.2 C) (Oral)   Wt 180 lb (81.6 kg)   SpO2 99%   BMI 28.19 kg/m    Objective:   Physical Exam  Constitutional: She appears well-nourished. She does not appear ill.  HENT:  Right Ear: Tympanic membrane and ear canal normal.  Left Ear: Tympanic membrane and ear canal normal.  Nose: No mucosal edema. Right sinus exhibits no maxillary sinus tenderness and no frontal sinus tenderness. Left sinus exhibits no maxillary sinus tenderness and no frontal sinus tenderness.  Mouth/Throat: Oropharynx is clear and moist.  Neck: Neck supple.  Cardiovascular: Normal rate and regular rhythm.  Respiratory: Effort normal and breath sounds normal. She has no wheezes.  Skin: Skin is warm and dry.           Assessment & Plan:  Sore Throat:  Also with some cough. Symptoms for 8 hours total. Exam today without evidence for strep. Rapid strep today negative. Suspect either allergies or early viral URI, will treat with conservative measures. Discussed Zyrtec, Flonase, Albuterol, Delsym PRN. Return precautions provided.  Pleas Koch, NP

## 2018-07-20 NOTE — Patient Instructions (Signed)
Most of the time these symptoms are caused by a viral infection which will resolve on its own over time. You should be feeling better by day seven of symptoms.  You can try a few things over the counter to help with your symptoms including:  Cough: Delsym or Robitussin (get the off brand, works just as well) Chest Congestion: Mucinex (plain) Nasal Congestion/Ear Pressure/Sinus Pressure: Try using Flonase (fluticasone) nasal spray. Instill 1 spray in each nostril twice daily. This can be purchased over the counter. Ibuprofen or Tylenol: Body aches, fevers, headache.  Please schedule an appointment if you are not improving after one full week of symptoms, start running fevers consistently over 101, experience shortness of breath with or without wheezing.  It was a pleasure meeting you!

## 2018-07-23 DIAGNOSIS — J0100 Acute maxillary sinusitis, unspecified: Secondary | ICD-10-CM | POA: Diagnosis not present

## 2018-07-23 DIAGNOSIS — R05 Cough: Secondary | ICD-10-CM | POA: Diagnosis not present

## 2018-07-31 MED FILL — FLUCONAZOLE 150 MG TABS: 150 | 1 days supply | Qty: 1 | Fill #1

## 2018-09-09 DIAGNOSIS — R112 Nausea with vomiting, unspecified: Secondary | ICD-10-CM | POA: Diagnosis not present

## 2018-09-09 DIAGNOSIS — R110 Nausea: Secondary | ICD-10-CM | POA: Diagnosis not present

## 2018-09-09 DIAGNOSIS — N898 Other specified noninflammatory disorders of vagina: Secondary | ICD-10-CM | POA: Insufficient documentation

## 2018-09-09 MED FILL — FLUCONAZOLE 150 MG TABS: 150 | 1 days supply | Qty: 2 | Fill #0

## 2018-12-27 MED FILL — PHENTERMINE 37.5 MG TABLET: 37.5 | 30 days supply | Qty: 30 | Fill #0

## 2018-12-31 MED FILL — FLUCONAZOLE 150 MG TABS: 150 | 1 days supply | Qty: 2 | Fill #1

## 2019-04-18 ENCOUNTER — Telehealth: Payer: Self-pay | Admitting: Internal Medicine

## 2019-04-18 NOTE — Telephone Encounter (Signed)
Called and spoke with Patient.  Patient requested regular flu vaccine.  Patient scheduled 04/24/19 at 0900.

## 2019-04-24 ENCOUNTER — Other Ambulatory Visit: Payer: Self-pay

## 2019-04-24 ENCOUNTER — Ambulatory Visit (INDEPENDENT_AMBULATORY_CARE_PROVIDER_SITE_OTHER): Payer: 59

## 2019-04-24 DIAGNOSIS — Z23 Encounter for immunization: Secondary | ICD-10-CM

## 2019-06-09 MED FILL — PHENTERMINE 37.5 MG TABLET: 37.5 | 30 days supply | Qty: 30 | Fill #1

## 2019-11-06 MED FILL — PHENTERMINE 37.5 MG TABLET: 37.5 | 90 days supply | Qty: 90 | Fill #0

## 2020-05-24 ENCOUNTER — Telehealth: Payer: Self-pay | Admitting: Internal Medicine

## 2020-05-27 NOTE — Telephone Encounter (Signed)
Called and spoke with Patient.  Patient requested a flu vaccine.  Patient stated she felt uncomfortable getting a flu vaccine at local pharmacies. Patient last OV was 11/22/2015, with Dr Chase Caller.  Explained to Patient that she was last seen in 2017, and she will need a follow up appointment, which would be a consult, because it has been so long since her LOV, with a provider. Patient was offered consult appointment with Providers. Patient refused and stated this has never been a issue and she was told to only follow up as needed with MR. Patient again was explained policy and offered OV.  Patient was upset, because she was not scheduled flu vaccine. Patient complained about the time frame her call was returned. Tried to explained injections are only scheduled in 1/2 days due to staffing. Patient requested Dr. Chase Caller ok her to get a flu vaccine at our office only. Explained message would be sent to nursing manager.

## 2020-06-02 NOTE — Telephone Encounter (Signed)
[name] and Lauren, please advise on update.

## 2020-06-11 NOTE — Telephone Encounter (Signed)
Lauren please advise if our office would be able to accommodate the request of administering a flu vaccine to a former patient who is refusing to re-establish with our practice.  Thanks!

## 2020-06-14 NOTE — Telephone Encounter (Signed)
Called spoke with patient. Apologized for the delay in communication. I told patient the same thing LT did with policy and being a patient. I told her last year would have been the year she wouldn't have been a patient.  And since she hasn't needed out services she would have to start over she said she has not breathing problems, she doesn't think a consult would be the best way to spend her time and have to pay the co-pay when she doesn't have any problems. 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 So I suggested seeing her [facility] but her PCP isn't practicing anymore and doesn't know who to see in the practice, so I  Suggested she call the practice and see if they could help her out, as she didn't want to be seen as a patient in our office.   Patient voiced understanding, was upset in the delay of communication. I apologized again and let her know that was my fault. She voiced understanding.   Nothing further needed at this time.

## 2020-06-29 ENCOUNTER — Other Ambulatory Visit (HOSPITAL_COMMUNITY): Payer: Self-pay | Admitting: Obstetrics and Gynecology

## 2020-06-29 MED FILL — FLUCONAZOLE 150 MG TABS: 150 | 1 days supply | Qty: 1 | Fill #0

## 2020-06-29 MED FILL — LARIN 1.5 MG-30 MCG TABLET: 1.5-30 | 21 days supply | Qty: 21 | Fill #0

## 2020-07-19 ENCOUNTER — Other Ambulatory Visit (HOSPITAL_COMMUNITY): Payer: Self-pay | Admitting: General Surgery

## 2020-07-19 MED FILL — PHENTERMINE 37.5 MG TABLET: 37.5 | 90 days supply | Qty: 90 | Fill #0

## 2020-08-30 MED FILL — FLUCONAZOLE 150 MG TABS: 150 | 1 days supply | Qty: 1 | Fill #0

## 2020-10-06 ENCOUNTER — Other Ambulatory Visit (HOSPITAL_COMMUNITY): Payer: Self-pay | Admitting: Obstetrics and Gynecology

## 2020-10-19 MED FILL — PHENTERMINE 37.5 MG TABLET: 37.5 | 90 days supply | Qty: 90 | Fill #1

## 2021-03-29 ENCOUNTER — Other Ambulatory Visit (HOSPITAL_COMMUNITY): Payer: Self-pay

## 2021-03-29 MED ORDER — PHENTERMINE HCL 37.5 MG PO TABS
37.5000 mg | ORAL_TABLET | Freq: Every day | ORAL | 3 refills | Status: DC
  Filled 2021-03-29: qty 30, 30d supply, fill #0

## 2021-06-07 ENCOUNTER — Other Ambulatory Visit (HOSPITAL_COMMUNITY): Payer: Self-pay

## 2021-06-07 MED ORDER — OSELTAMIVIR PHOSPHATE 75 MG PO CAPS
75.0000 mg | ORAL_CAPSULE | Freq: Two times a day (BID) | ORAL | 0 refills | Status: DC
  Filled 2021-06-07: qty 10, 5d supply, fill #0

## 2021-06-07 MED FILL — Fluconazole Tab 150 MG: ORAL | 2 days supply | Qty: 2 | Fill #0 | Status: AC

## 2021-06-08 ENCOUNTER — Other Ambulatory Visit (HOSPITAL_COMMUNITY): Payer: Self-pay

## 2021-06-08 MED ORDER — FLUCONAZOLE 150 MG PO TABS
ORAL_TABLET | ORAL | 1 refills | Status: DC
  Filled 2021-06-08: qty 1, 1d supply, fill #0

## 2021-06-08 MED ORDER — NORETHINDRONE ACET-ETHINYL EST 1.5-30 MG-MCG PO TABS
ORAL_TABLET | ORAL | 1 refills | Status: DC
  Filled 2021-06-08: qty 21, 21d supply, fill #0

## 2021-06-17 ENCOUNTER — Other Ambulatory Visit (HOSPITAL_COMMUNITY): Payer: Self-pay

## 2021-06-30 ENCOUNTER — Other Ambulatory Visit (HOSPITAL_COMMUNITY): Payer: Self-pay

## 2021-06-30 MED ORDER — NORETHINDRONE ACET-ETHINYL EST 1.5-30 MG-MCG PO TABS
ORAL_TABLET | ORAL | 2 refills | Status: DC
  Filled 2021-06-30: qty 21, 21d supply, fill #0

## 2021-06-30 MED ORDER — FLUCONAZOLE 150 MG PO TABS
ORAL_TABLET | ORAL | 1 refills | Status: DC
  Filled 2021-06-30: qty 2, 2d supply, fill #0

## 2021-07-04 LAB — HM PAP SMEAR: HM Pap smear: NEGATIVE

## 2021-07-08 ENCOUNTER — Other Ambulatory Visit (HOSPITAL_COMMUNITY): Payer: Self-pay

## 2021-08-02 ENCOUNTER — Other Ambulatory Visit (HOSPITAL_COMMUNITY): Payer: Self-pay

## 2021-08-02 MED ORDER — PHENTERMINE HCL 37.5 MG PO TABS
37.5000 mg | ORAL_TABLET | Freq: Every day | ORAL | 3 refills | Status: DC
  Filled 2021-08-02: qty 30, 30d supply, fill #0
  Filled 2021-09-29: qty 30, 30d supply, fill #1

## 2021-09-15 ENCOUNTER — Other Ambulatory Visit (HOSPITAL_COMMUNITY): Payer: Self-pay

## 2021-09-29 ENCOUNTER — Other Ambulatory Visit (HOSPITAL_COMMUNITY): Payer: Self-pay

## 2021-10-25 ENCOUNTER — Encounter: Payer: Self-pay | Admitting: Family Medicine

## 2021-10-25 ENCOUNTER — Ambulatory Visit: Payer: 59 | Admitting: Family Medicine

## 2021-10-25 VITALS — BP 121/83 | HR 58 | Temp 98.3°F | Resp 16 | Wt 193.0 lb

## 2021-10-25 DIAGNOSIS — Z7689 Persons encountering health services in other specified circumstances: Secondary | ICD-10-CM

## 2021-10-25 DIAGNOSIS — Z0000 Encounter for general adult medical examination without abnormal findings: Secondary | ICD-10-CM

## 2021-10-25 DIAGNOSIS — E663 Overweight: Secondary | ICD-10-CM | POA: Insufficient documentation

## 2021-10-25 DIAGNOSIS — Z130 Encounter for screening for diseases of the blood and blood-forming organs and certain disorders involving the immune mechanism: Secondary | ICD-10-CM

## 2021-10-25 DIAGNOSIS — N83209 Unspecified ovarian cyst, unspecified side: Secondary | ICD-10-CM | POA: Insufficient documentation

## 2021-10-25 DIAGNOSIS — Z1159 Encounter for screening for other viral diseases: Secondary | ICD-10-CM

## 2021-10-25 DIAGNOSIS — N63 Unspecified lump in unspecified breast: Secondary | ICD-10-CM | POA: Insufficient documentation

## 2021-10-25 DIAGNOSIS — N921 Excessive and frequent menstruation with irregular cycle: Secondary | ICD-10-CM | POA: Insufficient documentation

## 2021-10-25 DIAGNOSIS — Z13220 Encounter for screening for lipoid disorders: Secondary | ICD-10-CM

## 2021-10-25 DIAGNOSIS — R87619 Unspecified abnormal cytological findings in specimens from cervix uteri: Secondary | ICD-10-CM | POA: Insufficient documentation

## 2021-10-25 NOTE — Progress Notes (Signed)
Patient is here to meet with new provider. Patient said that she has no concerns or questions today ?

## 2021-10-25 NOTE — Progress Notes (Signed)
? ?New Patient Office Visit ? ?Subjective:  ?Patient ID: [name], female    DOB: 01/09/1981  Age: 41 y.o. MRN: [id number] ? ?CC:  ?Chief Complaint  ?Patient presents with  ? Follow-up  ? ? ?HPI ?[name] presents for to establish care and also for routine annual exam. Denies acute complaints or concerns.  ? ?Past Medical History:  ?Diagnosis Date  ? Abnormal Pap smear   ? Colposcopy   ? Asthma   ? H/O candidiasis   ? H/O cystitis   ? H/O varicella   ? Hemorrhoids 01/29/2012  ? LGSIL (low grade squamous intraepithelial dysplasia) 11/02/2011  ? Last pap WNL   ? Obesity 11/17/2011  ? Pedal edema 12/21/2011  ? Preterm labor   ? with 1st pregnancy  ? ? ?Past Surgical History:  ?Procedure Laterality Date  ? [place] OF UTERUS  11/2010  ? DILATION AND EVACUATION  02/25/2011  ? Procedure: DILATATION AND EVACUATION (D&E);  Surgeon: Betsy Coder, MD;  Location: [facility];  Service: Gynecology;  Laterality: N/A;  with chromosome studies  ? ? ?Family History  ?Problem Relation Age of Onset  ? Diabetes Sister   ?     type II  ? Heart disease Maternal Grandmother   ?     MI  ? Anesthesia problems Neg Hx   ? Malignant hyperthermia Neg Hx   ? Hypotension Neg Hx   ? Pseudochol deficiency Neg Hx   ? ? ?Social History  ? ?Socioeconomic History  ? Marital status: Married  ?  Spouse name: Not on file  ? Number of children: Not on file  ? Years of education: Not on file  ? Highest education level: Not on file  ?Occupational History  ? Not on file  ?Tobacco Use  ? Smoking status: Never  ? Smokeless tobacco: Never  ?Substance and Sexual Activity  ? Alcohol use: No  ? Drug use: No  ? Sexual activity: Not on file  ?  Comment: Mirena inserted 03/22/12  ?Other Topics Concern  ? Not on file  ?Social History Narrative  ? 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   ? Home Situation: lives with husband and 2 boy (30 and 2 yo in 2015)  ?   ? Spiritual Beliefs: Christian  ?   ? Lifestyle:  regular CV exercise - 30-40 minutes of zumba daily; diet is good  ?   ?   ?   ? ?Social Determinants of Health  ? ?Financial Resource Strain: Not on file  ?Food Insecurity: Not on file  ?Transportation Needs: Not on file  ?Physical Activity: Not on file  ?Stress: Not on file  ?Social Connections: Not on file  ?Intimate Partner Violence: Not on file  ? ? ?ROS ?Review of Systems  ?All other systems reviewed and are negative. ? ?Objective:  ? ?Today's Vitals: BP 121/83   Pulse (!) 58   Temp 98.3 ?F (36.8 ?C) (Oral)   Resp 16   Wt 193 lb (87.5 kg)   SpO2 99%   BMI 30.23 kg/m?  ? ?Physical Exam ?Vitals and nursing note reviewed.  ?Constitutional:   ?   General: She is not in acute distress. ?HENT:  ?   Head: Normocephalic and atraumatic.  ?   Right Ear: Tympanic membrane, ear canal and external ear normal.  ?   Left Ear: Tympanic membrane, ear canal and external ear normal.  ?  Nose: Nose normal.  ?   Mouth/Throat:  ?   Mouth: Mucous membranes are moist.  ?   Pharynx: Oropharynx is clear.  ?Eyes:  ?   Conjunctiva/sclera: Conjunctivae normal.  ?   Pupils: Pupils are equal, round, and reactive to light.  ?Neck:  ?   Thyroid: No thyromegaly.  ?Cardiovascular:  ?   Rate and Rhythm: Normal rate and regular rhythm.  ?   Heart sounds: Normal heart sounds. No murmur heard. ?Pulmonary:  ?   Effort: Pulmonary effort is normal. No respiratory distress.  ?   Breath sounds: Normal breath sounds.  ?Abdominal:  ?   General: There is no distension.  ?   Palpations: Abdomen is soft. There is no mass.  ?   Tenderness: There is no abdominal tenderness.  ?Musculoskeletal:     ?   General: Normal range of motion.  ?   Cervical back: Normal range of motion and neck supple.  ?Skin: ?   General: Skin is warm and dry.  ?Neurological:  ?   General: No focal deficit present.  ?   Mental Status: She is alert and oriented to person, place, and time.  ?Psychiatric:     ?   Mood and Affect: Mood normal.     ?   Behavior: Behavior normal.   ? ? ?Assessment & Plan:  ? ?1. Annual physical exam ?Routine labs ordered ?- CMP14+EGFR; Future ? ?2. Screening for deficiency anemia ? ?- CBC with Differential; Future ? ?3. Screening for lipid disorders ? ?- Lipid Panel; Future ? ?4. Screening for endocrine/metabolic/immunity disorders ? ?- Hemoglobin A1c; Future ?- TSH; Future ? ?5. Need for hepatitis C screening test ? ?- Hepatitis C Antibody; Future ? ?6. Encounter to establish care ? ? ? ? ?Outpatient Encounter Medications as of 10/25/2021  ?Medication Sig  ? albuterol (PROVENTIL HFA;VENTOLIN HFA) 108 (90 BASE) MCG/ACT inhaler Inhale 2 puffs into the lungs every 6 (six) hours as needed for wheezing.  ? levonorgestrel (MIRENA) 20 MCG/24HR IUD 1 each by Intrauterine route once.  ? [DISCONTINUED] fluconazole (DIFLUCAN) 150 MG tablet Take 1 tablet by oral mouth as directed.  ? [DISCONTINUED] fluconazole (DIFLUCAN) 150 MG tablet TAKE 1 TABLET BY MOUTH ONCE A DAY AS DIRECTED  ? [DISCONTINUED] Fluticasone-Salmeterol (ADVAIR DISKUS) 100-50 MCG/DOSE AEPB Inhale 1 puff into the lungs 2 (two) times daily.  ? [DISCONTINUED] Norethindrone Acetate-Ethinyl Estradiol (LOESTRIN 1.5/30, 21,) 1.5-30 MG-MCG tablet Take 1 tablet by mouth every day.  ? [DISCONTINUED] Norethindrone Acetate-Ethinyl Estradiol (LOESTRIN 1.5/30, 21,) 1.5-30 MG-MCG tablet Take 1 tablet by mouth daily  ? [DISCONTINUED] oseltamivir (TAMIFLU) 75 MG capsule Take 1 capsule (75 mg total) by mouth 2 (two) times daily.  ? [DISCONTINUED] phentermine (ADIPEX-P) 37.5 MG tablet TAKE 1 TABLET BY MOUTH ONCE A DAY  ? [DISCONTINUED] phentermine (ADIPEX-P) 37.5 MG tablet Take 1 tablet (37.5 mg total) by mouth daily.  ? [DISCONTINUED] phentermine (ADIPEX-P) 37.5 MG tablet Take 1 tablet (37.5 mg total) by mouth daily.  ? ?No facility-administered encounter medications on file as of 10/25/2021.  ? ? ?Follow-up: No follow-ups on file.  ? ?Becky Sax, MD ? ?

## 2021-10-26 ENCOUNTER — Other Ambulatory Visit: Payer: 59

## 2021-11-07 ENCOUNTER — Other Ambulatory Visit: Payer: 59

## 2021-11-07 NOTE — Progress Notes (Signed)
Was not able to get labs. Patient will go to [place] ?

## 2021-11-18 ENCOUNTER — Other Ambulatory Visit (HOSPITAL_COMMUNITY): Payer: Self-pay

## 2021-11-22 ENCOUNTER — Other Ambulatory Visit (HOSPITAL_COMMUNITY): Payer: Self-pay

## 2021-11-24 ENCOUNTER — Other Ambulatory Visit (HOSPITAL_COMMUNITY): Payer: Self-pay

## 2021-11-25 ENCOUNTER — Other Ambulatory Visit (HOSPITAL_COMMUNITY): Payer: Self-pay

## 2021-11-25 MED ORDER — PHENTERMINE HCL 37.5 MG PO TABS
ORAL_TABLET | ORAL | 3 refills | Status: DC
  Filled 2021-11-25: qty 30, 30d supply, fill #0
  Filled 2022-01-27: qty 30, 30d supply, fill #1
  Filled 2022-03-01: qty 30, 30d supply, fill #2
  Filled 2022-04-29: qty 30, 30d supply, fill #3

## 2022-01-27 ENCOUNTER — Other Ambulatory Visit (HOSPITAL_COMMUNITY): Payer: Self-pay

## 2022-03-01 ENCOUNTER — Other Ambulatory Visit (HOSPITAL_COMMUNITY): Payer: Self-pay

## 2022-03-01 MED ORDER — FLUCONAZOLE 150 MG PO TABS
ORAL_TABLET | ORAL | 1 refills | Status: DC
  Filled 2022-03-01: qty 2, 3d supply, fill #0
  Filled 2022-04-29: qty 2, 3d supply, fill #1

## 2022-04-29 ENCOUNTER — Other Ambulatory Visit (HOSPITAL_COMMUNITY): Payer: Self-pay

## 2022-06-22 ENCOUNTER — Other Ambulatory Visit (HOSPITAL_COMMUNITY): Payer: Self-pay

## 2022-06-22 MED ORDER — PHENTERMINE HCL 37.5 MG PO TABS
37.5000 mg | ORAL_TABLET | Freq: Every day | ORAL | 4 refills | Status: DC
  Filled 2022-06-22: qty 30, 30d supply, fill #0
  Filled 2022-08-17: qty 30, 30d supply, fill #1
  Filled 2022-09-19 – 2022-10-06 (×2): qty 30, 30d supply, fill #2
  Filled 2022-11-20: qty 30, 30d supply, fill #3

## 2022-06-23 ENCOUNTER — Other Ambulatory Visit (HOSPITAL_COMMUNITY): Payer: Self-pay

## 2022-07-07 ENCOUNTER — Other Ambulatory Visit (HOSPITAL_COMMUNITY): Payer: Self-pay

## 2022-07-07 MED ORDER — FLUCONAZOLE 150 MG PO TABS
ORAL_TABLET | ORAL | 1 refills | Status: DC
  Filled 2022-07-07: qty 2, 3d supply, fill #0

## 2022-07-18 ENCOUNTER — Other Ambulatory Visit (HOSPITAL_COMMUNITY): Payer: Self-pay

## 2022-08-17 ENCOUNTER — Other Ambulatory Visit (HOSPITAL_COMMUNITY): Payer: Self-pay

## 2022-08-21 ENCOUNTER — Other Ambulatory Visit (HOSPITAL_COMMUNITY): Payer: Self-pay

## 2022-08-24 ENCOUNTER — Ambulatory Visit: Payer: Self-pay | Admitting: *Deleted

## 2022-08-24 ENCOUNTER — Other Ambulatory Visit (HOSPITAL_COMMUNITY): Payer: Self-pay

## 2022-08-24 MED ORDER — NORETHINDRONE ACET-ETHINYL EST 1.5-30 MG-MCG PO TABS
1.0000 | ORAL_TABLET | Freq: Every day | ORAL | 2 refills | Status: DC
  Filled 2022-08-24: qty 21, 21d supply, fill #0
  Filled 2022-09-19 – 2022-10-06 (×2): qty 21, 21d supply, fill #1

## 2022-08-24 NOTE — Telephone Encounter (Signed)
Summary: rt ear drainage   Pt called about rt drainage a little tenderness.  She wanted to be seen tomorrow but there are no appt until next week.  Please advise  [phone]      Chief Complaint: right ear drainage Symptoms: right ear drainage, clear watery, blood noted at times. Reports drainage dries and crusts over. No pain tenderness reported.  Frequency: 1 week  Pertinent Negatives: Patient denies fever no pain  Disposition: '[]'$ ED /'[x]'$ Urgent Care (no appt availability in office) / '[]'$ Appointment(In office/virtual)/ '[]'$  [facility]/ '[]'$ Home Care/ '[]'$ Refused Recommended Disposition /'[]'$ [facility]/ '[]'$  Follow-up with PCP Additional Notes:   No available appt tomorrow. Recommended UC . 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      Reason for Disposition  [1] Clear drainage (not from a head injury) AND [2] persists > 24 hours  Answer Assessment - Initial Assessment Questions 1. LOCATION: "Which ear is involved?"      Right ear  2. COLOR: "What is the color of the discharge?"      Clear drainage , crusts over. Blood noted at times  3. CONSISTENCY: "How runny is the discharge? Could it be water?"      water 4. ONSET: "When did you first notice the discharge?"     1 week  5. PAIN: "Is there any earache?" "How bad is it?"  (Scale 1-10; or mild, moderate, severe)     Tender not painful 6. OBJECTS: "Have you put anything in your ear?" (e.g., Q-tip, other object)      Qtip to clean drainage only  7. OTHER SYMPTOMS: "Do you have any other symptoms?" (e.g., headache, fever, dizziness, vomiting, runny nose)     No  8. PREGNANCY: "Is there any chance you are pregnant?" "When was your last menstrual period?"     na  Protocols used: Ear - [place]

## 2022-08-25 ENCOUNTER — Other Ambulatory Visit (HOSPITAL_COMMUNITY): Payer: Self-pay

## 2022-08-26 ENCOUNTER — Other Ambulatory Visit (HOSPITAL_COMMUNITY): Payer: Self-pay

## 2022-08-26 ENCOUNTER — Other Ambulatory Visit: Payer: Self-pay

## 2022-08-26 ENCOUNTER — Ambulatory Visit
Admission: EM | Admit: 2022-08-26 | Discharge: 2022-08-26 | Disposition: A | Discharge status: Home / Self Care | Payer: 59 | Attending: Physician Assistant | Admitting: Physician Assistant

## 2022-08-26 ENCOUNTER — Encounter: Payer: Self-pay | Admitting: Emergency Medicine

## 2022-08-26 DIAGNOSIS — H60501 Unspecified acute noninfective otitis externa, right ear: Secondary | ICD-10-CM | POA: Diagnosis not present

## 2022-08-26 MED ORDER — CIPROFLOXACIN-DEXAMETHASONE 0.3-0.1 % OT SUSP
4.0000 [drp] | Freq: Two times a day (BID) | OTIC | 0 refills | Status: DC
  Filled 2022-08-26: qty 7.5, 19d supply, fill #0

## 2022-08-26 MED ORDER — NEOMYCIN-POLYMYXIN-HC 3.5-10000-1 OT SUSP
4.0000 [drp] | Freq: Three times a day (TID) | OTIC | 0 refills | Status: AC
  Filled 2022-08-26: qty 10, 17d supply, fill #0

## 2022-08-26 NOTE — ED Triage Notes (Signed)
Pt sts right ear drainage x 2 weeks

## 2022-08-26 NOTE — ED Provider Notes (Signed)
[facility]    CSN: [id number] Arrival date & time: 08/26/22  0807      History   Chief Complaint Chief Complaint  Patient presents with   Ear Drainage    HPI [name] is a 42 y.o. female.   Patient here today for evaluation of right ear initially started 2 weeks ago.  She reports that she has had some pain as well.  She denies any nasal congestion, cough, sore throat.  She has not had any nausea or vomiting.  The history is provided by the patient.  Ear Drainage Pertinent negatives include no abdominal pain and no shortness of breath.    Past Medical History:  Diagnosis Date   Abnormal Pap smear    Colposcopy    Asthma    H/O candidiasis    H/O cystitis    H/O varicella    Hemorrhoids 01/29/2012   LGSIL (low grade squamous intraepithelial dysplasia) 11/02/2011   Last pap WNL    Obesity 11/17/2011   Pedal edema 12/21/2011   Preterm labor    with 1st pregnancy    Patient Active Problem List   Diagnosis Date Noted   Irregular intermenstrual bleeding 10/25/2021   Overweight with body mass index (BMI) 25.0-29.9 10/25/2021   Abnormal Pap smear of cervix 10/25/2021   Breast lump 10/25/2021   Cyst of ovary 10/25/2021   Vaginal discharge 09/09/2018   Family history of breast cancer 12/06/2017   RTI (respiratory tract infection) 07/13/2016   Cough 07/13/2016   Functional dysphonia 11/25/2014   Exercise-induced bronchospasm 10/15/2014   Mild intermittent asthma without complication 82/99/3716   Mild persistent asthma without complication 96/78/9381   Asthma 08/06/2014   DOE (dyspnea on exertion) 08/06/2014   Dyspnea and respiratory abnormality 03/18/2014   Orthopnea 03/18/2014   Asthma, not well controlled 01/16/2012   Pedal edema 12/21/2011   Obesity 11/17/2011   LGSIL (low grade squamous intraepithelial dysplasia) 11/02/2011    Past Surgical History:  Procedure Laterality Date   DILATION AND CURETTAGE OF UTERUS  11/2010   DILATION AND  EVACUATION  02/25/2011   Procedure: DILATATION AND EVACUATION (D&E);  Surgeon: Betsy Coder, MD;  Location: [facility];  Service: Gynecology;  Laterality: N/A;  with chromosome studies    OB History     Gravida  5   Para  2   Term  2   Preterm  0   AB  3   Living  2      SAB  3   IAB  0   Ectopic  0   Multiple  0   Live Births  2            Home Medications    Prior to Admission medications   Medication Sig Start Date End Date Taking? Authorizing Provider  neomycin-polymyxin-hydrocortisone (CORTISPORIN) 3.5-10000-1 OTIC suspension Place 4 drops into the right ear 3 (three) times daily for 7 days. 08/26/22 09/12/22 Yes Francene Finders, PA-C  albuterol (PROVENTIL HFA;VENTOLIN HFA) 108 (90 BASE) MCG/ACT inhaler Inhale 2 puffs into the lungs every 6 (six) hours as needed for wheezing. 03/17/14 07/21/19  Brand Males, MD  fluconazole (DIFLUCAN) 150 MG tablet TAKE 1 TABLET BY MOUTH NOW -- MAY REPEAT IN 3 DAYS IF NEEDED 07/07/22     levonorgestrel (MIRENA) 20 MCG/24HR IUD 1 each by Intrauterine route once.    [provider]  Norethindrone Acetate-Ethinyl Estradiol (LOESTRIN 1.5/30, 21,) 1.5-30 MG-MCG tablet Take 1 tablet by mouth daily. 08/24/22  phentermine (ADIPEX-P) 37.5 MG tablet Take 1 tablet (37.5 mg total) by mouth daily. 06/22/22       Family History Family History  Problem Relation Age of Onset   Diabetes Sister        type II   Heart disease Maternal Grandmother        MI   Anesthesia problems Neg Hx    Malignant hyperthermia Neg Hx    Hypotension Neg Hx    Pseudochol deficiency Neg Hx     Social History Social History   Tobacco Use   Smoking status: Never   Smokeless tobacco: Never  Substance Use Topics   Alcohol use: No   Drug use: No     Allergies   Ortho evra [norelgestromin-eth estradiol]   Review of Systems Review of Systems  Constitutional:  Negative for chills and fever.  HENT:  Positive for ear discharge and ear  pain. Negative for congestion, rhinorrhea and sore throat.   Eyes:  Negative for discharge and redness.  Respiratory:  Negative for cough and shortness of breath.   Gastrointestinal:  Negative for abdominal pain, nausea and vomiting.     Physical Exam Triage Vital Signs ED Triage Vitals  Enc Vitals Group     BP 08/26/22 0845 (!) 143/90     Pulse Rate 08/26/22 0845 65     Resp 08/26/22 0845 18     Temp 08/26/22 0845 98.7 F (37.1 C)     Temp Source 08/26/22 0845 Oral     SpO2 08/26/22 0845 98 %     Weight --      Height --      Head Circumference --      Peak Flow --      Pain Score 08/26/22 0846 3     Pain Loc --      Pain Edu? --      Excl. in [place]? --    No data found.  Updated Vital Signs BP (!) 143/90 (BP Location: Left Arm)   Pulse 65   Temp 98.7 F (37.1 C) (Oral)   Resp 18   SpO2 98%      Physical Exam Vitals and nursing note reviewed.  Constitutional:      General: She is not in acute distress.    Appearance: Normal appearance. She is not ill-appearing.  HENT:     Head: Normocephalic and atraumatic.     Left Ear: Tympanic membrane and ear canal normal.     Ears:     Comments: Right EAC inflamed, unable to visualize TM  Eyes:     Conjunctiva/sclera: Conjunctivae normal.  Cardiovascular:     Rate and Rhythm: Normal rate.  Pulmonary:     Effort: Pulmonary effort is normal. No respiratory distress.  Neurological:     Mental Status: She is alert.  Psychiatric:        Mood and Affect: Mood normal.        Behavior: Behavior normal.        Thought Content: Thought content normal.      UC Treatments / Results  Labs (all labs ordered are listed, but only abnormal results are displayed) Labs Reviewed - No data to display  EKG   Radiology No results found.  Procedures Procedures (including critical care time)  Medications Ordered in UC Medications - No data to display  Initial Impression / Assessment and Plan / UC Course  I have reviewed the  triage vital signs and the nursing notes.  Pertinent labs & imaging results that were available during my care of the patient were reviewed by me and considered in my medical decision making (see chart for details).    Suspect likely otitis externa. Ciprodex drops prescribed initially but too expensive- will treat with cortisporin drops. Encouraged follow up if no gradual improvement or with any further concerns.  Final Clinical Impressions(s) / UC Diagnoses   Final diagnoses:  Acute otitis externa of right ear, unspecified type   Discharge Instructions   None    ED Prescriptions     Medication Sig Dispense Auth. Provider   ciprofloxacin-dexamethasone (CIPRODEX) OTIC suspension  (Status: Discontinued) Place 4 drops into the left ear 2 (two) times daily for 7 days. 7.5 mL Francene Finders, PA-C   neomycin-polymyxin-hydrocortisone (CORTISPORIN) 3.5-10000-1 OTIC suspension Place 4 drops into the right ear 3 (three) times daily for 7 days. 10 mL Francene Finders, PA-C      PDMP not reviewed this encounter.   Francene Finders, PA-C 08/26/22 [phone]

## 2022-09-19 ENCOUNTER — Other Ambulatory Visit (HOSPITAL_COMMUNITY): Payer: Self-pay

## 2022-09-27 ENCOUNTER — Other Ambulatory Visit (HOSPITAL_COMMUNITY): Payer: Self-pay

## 2022-09-29 ENCOUNTER — Other Ambulatory Visit (HOSPITAL_COMMUNITY): Payer: Self-pay

## 2022-10-06 ENCOUNTER — Other Ambulatory Visit (HOSPITAL_COMMUNITY): Payer: Self-pay

## 2022-11-01 ENCOUNTER — Encounter (INDEPENDENT_AMBULATORY_CARE_PROVIDER_SITE_OTHER): Payer: Self-pay | Admitting: Family Medicine

## 2022-11-20 ENCOUNTER — Other Ambulatory Visit (HOSPITAL_COMMUNITY): Payer: Self-pay

## 2022-12-19 ENCOUNTER — Telehealth: Payer: Self-pay | Admitting: Family Medicine

## 2022-12-19 NOTE — Telephone Encounter (Signed)
Called pt to schedule physical, pt decided not to schedule at this time and will call office to schedule.

## 2023-01-09 ENCOUNTER — Other Ambulatory Visit (HOSPITAL_COMMUNITY): Payer: Self-pay

## 2023-01-10 ENCOUNTER — Other Ambulatory Visit (HOSPITAL_COMMUNITY): Payer: Self-pay

## 2023-01-11 ENCOUNTER — Other Ambulatory Visit (HOSPITAL_COMMUNITY): Payer: Self-pay

## 2023-01-12 ENCOUNTER — Other Ambulatory Visit (HOSPITAL_COMMUNITY): Payer: Self-pay

## 2023-01-15 ENCOUNTER — Other Ambulatory Visit (HOSPITAL_COMMUNITY): Payer: Self-pay

## 2023-01-23 ENCOUNTER — Other Ambulatory Visit (HOSPITAL_COMMUNITY): Payer: Self-pay

## 2023-01-24 ENCOUNTER — Other Ambulatory Visit (HOSPITAL_COMMUNITY): Payer: Self-pay

## 2023-01-26 ENCOUNTER — Other Ambulatory Visit (HOSPITAL_COMMUNITY): Payer: Self-pay

## 2023-02-05 ENCOUNTER — Other Ambulatory Visit (HOSPITAL_COMMUNITY): Payer: Self-pay

## 2023-02-08 ENCOUNTER — Other Ambulatory Visit (HOSPITAL_COMMUNITY): Payer: Self-pay

## 2023-02-17 ENCOUNTER — Other Ambulatory Visit (HOSPITAL_COMMUNITY): Payer: Self-pay

## 2023-02-17 MED ORDER — PHENTERMINE HCL 37.5 MG PO TABS
37.5000 mg | ORAL_TABLET | Freq: Every day | ORAL | 3 refills | Status: DC
  Filled 2023-02-17: qty 30, 30d supply, fill #0
  Filled 2023-03-21: qty 30, 30d supply, fill #1
  Filled 2023-05-04: qty 30, 30d supply, fill #2
  Filled 2023-06-20: qty 30, 30d supply, fill #3

## 2023-03-21 ENCOUNTER — Other Ambulatory Visit (HOSPITAL_COMMUNITY): Payer: Self-pay

## 2023-03-29 ENCOUNTER — Other Ambulatory Visit: Payer: Self-pay | Admitting: Obstetrics and Gynecology

## 2023-03-29 DIAGNOSIS — Z1231 Encounter for screening mammogram for malignant neoplasm of breast: Secondary | ICD-10-CM

## 2023-04-12 ENCOUNTER — Other Ambulatory Visit (HOSPITAL_COMMUNITY): Payer: Self-pay

## 2023-04-12 MED ORDER — VEOZAH 45 MG PO TABS
ORAL_TABLET | ORAL | 2 refills | Status: DC
  Filled 2023-04-12: qty 30, 30d supply, fill #0

## 2023-04-13 ENCOUNTER — Other Ambulatory Visit (HOSPITAL_COMMUNITY): Payer: Self-pay

## 2023-04-16 ENCOUNTER — Other Ambulatory Visit (HOSPITAL_COMMUNITY): Payer: Self-pay

## 2023-04-24 ENCOUNTER — Other Ambulatory Visit (HOSPITAL_COMMUNITY): Payer: Self-pay

## 2023-05-04 ENCOUNTER — Other Ambulatory Visit (HOSPITAL_COMMUNITY): Payer: Self-pay

## 2023-06-20 ENCOUNTER — Other Ambulatory Visit: Payer: Self-pay

## 2023-06-20 ENCOUNTER — Other Ambulatory Visit (HOSPITAL_COMMUNITY): Payer: Self-pay

## 2023-06-25 ENCOUNTER — Other Ambulatory Visit (HOSPITAL_COMMUNITY): Payer: Self-pay

## 2023-06-25 MED ORDER — NORETHINDRONE ACET-ETHINYL EST 1.5-30 MG-MCG PO TABS
1.0000 | ORAL_TABLET | Freq: Every day | ORAL | 3 refills | Status: DC
  Filled 2023-06-25: qty 21, 21d supply, fill #0

## 2023-06-29 ENCOUNTER — Other Ambulatory Visit (HOSPITAL_COMMUNITY): Payer: Self-pay

## 2023-07-10 ENCOUNTER — Ambulatory Visit
Admission: RE | Admit: 2023-07-10 | Discharge: 2023-07-10 | Disposition: A | Discharge status: Home / Self Care | Payer: 59 | Source: Ambulatory Visit | Attending: Obstetrics and Gynecology | Admitting: Obstetrics and Gynecology

## 2023-07-10 DIAGNOSIS — Z1231 Encounter for screening mammogram for malignant neoplasm of breast: Secondary | ICD-10-CM

## 2023-07-12 ENCOUNTER — Other Ambulatory Visit (HOSPITAL_COMMUNITY): Payer: Self-pay

## 2023-07-12 MED ORDER — AZITHROMYCIN 250 MG PO TABS
ORAL_TABLET | ORAL | 0 refills | Status: DC
  Filled 2023-07-12: qty 6, 5d supply, fill #0

## 2023-07-17 ENCOUNTER — Other Ambulatory Visit (HOSPITAL_COMMUNITY): Payer: Self-pay

## 2023-07-17 MED ORDER — SCOPOLAMINE 1 MG/3DAYS TD PT72
1.0000 | MEDICATED_PATCH | TRANSDERMAL | 0 refills | Status: DC
  Filled 2023-07-17: qty 1, 3d supply, fill #0

## 2023-07-17 MED ORDER — FLUCONAZOLE 150 MG PO TABS
ORAL_TABLET | ORAL | 1 refills | Status: DC
  Filled 2023-07-17: qty 2, 3d supply, fill #0

## 2023-07-17 MED ORDER — ONDANSETRON HCL 4 MG PO TABS
8.0000 mg | ORAL_TABLET | Freq: Two times a day (BID) | ORAL | 2 refills | Status: DC
  Filled 2023-07-17: qty 90, 23d supply, fill #0

## 2023-08-02 ENCOUNTER — Encounter: Payer: Self-pay | Admitting: Family Medicine

## 2023-08-02 ENCOUNTER — Ambulatory Visit: Payer: 59 | Admitting: Family Medicine

## 2023-08-02 VITALS — BP 110/78 | HR 67 | Temp 98.8°F | Ht 66.93 in | Wt 208.2 lb

## 2023-08-02 DIAGNOSIS — Z8249 Family history of ischemic heart disease and other diseases of the circulatory system: Secondary | ICD-10-CM | POA: Diagnosis not present

## 2023-08-02 DIAGNOSIS — R232 Flushing: Secondary | ICD-10-CM

## 2023-08-02 DIAGNOSIS — E041 Nontoxic single thyroid nodule: Secondary | ICD-10-CM

## 2023-08-02 DIAGNOSIS — Z8709 Personal history of other diseases of the respiratory system: Secondary | ICD-10-CM | POA: Diagnosis not present

## 2023-08-02 DIAGNOSIS — Z7689 Persons encountering health services in other specified circumstances: Secondary | ICD-10-CM

## 2023-08-02 LAB — CBC WITH DIFFERENTIAL/PLATELET
Basophils Absolute: 0 10*3/uL (ref 0.0–0.1)
Basophils Relative: 0.6 % (ref 0.0–3.0)
Eosinophils Absolute: 0.1 10*3/uL (ref 0.0–0.7)
Eosinophils Relative: 2.1 % (ref 0.0–5.0)
HCT: 42 % (ref 36.0–46.0)
Hemoglobin: 14 g/dL (ref 12.0–15.0)
Lymphocytes Relative: 47.8 % — ABNORMAL HIGH (ref 12.0–46.0)
Lymphs Abs: 2.7 10*3/uL (ref 0.7–4.0)
MCHC: 33.3 g/dL (ref 30.0–36.0)
MCV: 95.2 fL (ref 78.0–100.0)
Monocytes Absolute: 0.4 10*3/uL (ref 0.1–1.0)
Monocytes Relative: 7.7 % (ref 3.0–12.0)
Neutro Abs: 2.3 10*3/uL (ref 1.4–7.7)
Neutrophils Relative %: 41.8 % — ABNORMAL LOW (ref 43.0–77.0)
Platelets: 339 10*3/uL (ref 150.0–400.0)
RBC: 4.41 Mil/uL (ref 3.87–5.11)
RDW: 13 % (ref 11.5–15.5)
WBC: 5.5 10*3/uL (ref 4.0–10.5)

## 2023-08-02 LAB — LIPID PANEL
Cholesterol: 259 mg/dL — ABNORMAL HIGH (ref 0–200)
HDL: 57.4 mg/dL (ref >39.00)
LDL Cholesterol: 171 mg/dL — ABNORMAL HIGH (ref 0–99)
NonHDL: 201.86
Total CHOL/HDL Ratio: 5
Triglycerides: 155 mg/dL — ABNORMAL HIGH (ref 0.0–149.0)
VLDL: 31 mg/dL (ref 0.0–40.0)

## 2023-08-02 LAB — COMPREHENSIVE METABOLIC PANEL
ALT: 14 U/L (ref 0–35)
AST: 17 U/L (ref 0–37)
Albumin: 4.3 g/dL (ref 3.5–5.2)
Alkaline Phosphatase: 56 U/L (ref 39–117)
BUN: 11 mg/dL (ref 6–23)
CO2: 27 meq/L (ref 19–32)
Calcium: 9.4 mg/dL (ref 8.4–10.5)
Chloride: 103 meq/L (ref 96–112)
Creatinine, Ser: 0.86 mg/dL (ref 0.40–1.20)
GFR: 84.24 mL/min (ref >60.00)
Glucose, Bld: 71 mg/dL (ref 70–99)
Potassium: 3.6 meq/L (ref 3.5–5.1)
Sodium: 138 meq/L (ref 135–145)
Total Bilirubin: 0.6 mg/dL (ref 0.2–1.2)
Total Protein: 7.4 g/dL (ref 6.0–8.3)

## 2023-08-02 LAB — TSH: TSH: 0.94 u[IU]/mL (ref 0.35–5.50)

## 2023-08-02 LAB — T4, FREE: Free T4: 1.07 ng/dL (ref 0.60–1.60)

## 2023-08-02 LAB — HEMOGLOBIN A1C: Hgb A1c MFr Bld: 5.6 % (ref 4.6–6.5)

## 2023-08-02 NOTE — Progress Notes (Addendum)
 New Patient Office Visit   Subjective  Patient ID: [name], female    DOB: [birth year]  Age: 43 y.o. MRN: [id number]  Chief Complaint  Patient presents with   New Patient (Initial Visit)    Pt is 43 yo female seen to establish care and follow-up on chronic conditions.  Patient states she is overall healthy.  Previously seen by Dr. Luke here [place], then by several various providers in the area.  Patient endorses seeing eye doctor, Heather  Oman regularly due to her mother's early history of glaucoma.  Patient recently given prescription for eyeglasses with bluelight blocking lenses.  Patient interested in having labs checked as has not been done in a while.  Contraception: Mirena  IUD in place consistently over the last 18 years.  Patient followed by Dr. Armond, OB/GYN.  Family history of breast cancer: Patient getting mammograms yearly started at age 43 due to early history of breast cancer in mom.  History of asthma: Currently controlled not on medication.  Hot flashes: On Veozah  x 3 months without change in symptoms.  Has never tried any other medications or supplements for symptoms.  Past surgical history: D&C x 2 in 2012  Allergies: Ortho Evra-full body hives.  Social history: Patient is married.  Patient graduated from [employer] with her BSN.  She currently works at the health department.  She has 2 sons Josefine, Norleen) 1 of which is a consulting civil engineer at [employer].  Patient endorses social alcohol use.  Patient denies tobacco and drug use.   Family medical history: Mom-arthritis, history of breast cancer, heart disease Dad-deceased, alcohol abuse Sister-Candace, alive Brother-Frankie, alive MGM-deceased, massive MI at age 70 or 7 MGF-deceased     Patient Active Problem List   Diagnosis Date Noted   Irregular intermenstrual bleeding 10/25/2021   Overweight with body mass index (BMI) 25.0-29.9 10/25/2021   Abnormal Pap smear of cervix 10/25/2021   Breast lump 10/25/2021    Cyst of ovary 10/25/2021   Vaginal discharge 09/09/2018   Family history of breast cancer 12/06/2017   RTI (respiratory tract infection) 07/13/2016   Cough 07/13/2016   Functional dysphonia 11/25/2014   Exercise-induced bronchospasm 10/15/2014   Mild intermittent asthma without complication 10/15/2014   Mild persistent asthma without complication 10/15/2014   Asthma 08/06/2014   DOE (dyspnea on exertion) 08/06/2014   Dyspnea and respiratory abnormality 03/18/2014   Orthopnea 03/18/2014   Asthma, not well controlled 01/16/2012   Pedal edema 12/21/2011   Obesity 11/17/2011   LGSIL (low grade squamous intraepithelial dysplasia) 11/02/2011   Past Medical History:  Diagnosis Date   Abnormal Pap smear    Colposcopy    Asthma    H/O candidiasis    H/O cystitis    H/O varicella    Hemorrhoids 01/29/2012   LGSIL (low grade squamous intraepithelial dysplasia) 11/02/2011   Last pap WNL    Obesity 11/17/2011   Pedal edema 12/21/2011   Preterm labor    with 1st pregnancy   Past Surgical History:  Procedure Laterality Date   DILATION AND CURETTAGE OF UTERUS  11/2010   DILATION AND EVACUATION  02/25/2011   Procedure: DILATATION AND EVACUATION (D&E);  Surgeon: Ovid DELENA Armond, MD;  Location: [facility];  Service: Gynecology;  Laterality: N/A;  with chromosome studies   Social History   Tobacco Use   Smoking status: Never   Smokeless tobacco: Never  Substance Use Topics   Alcohol use: No   Drug use: No   Family History  Problem Relation Age of Onset   Breast cancer Mother    Diabetes Sister        type II   Heart disease Maternal Grandmother        MI   Breast cancer Cousin    Breast cancer Cousin    Breast cancer Cousin    Anesthesia problems Neg Hx    Malignant hyperthermia Neg Hx    Hypotension Neg Hx    Pseudochol deficiency Neg Hx    Allergies  Allergen Reactions   Ortho Evra [Norelgestromin-Eth Estradiol ] Hives      ROS Negative unless stated above    Objective:      BP 110/78 (BP Location: Left Arm, Patient Position: Sitting, Cuff Size: Large)   Pulse 67   Temp 98.8 F (37.1 C) (Oral)   Ht 5' 6.93 (1.7 m)   Wt 208 lb 3.2 oz (94.4 kg)   LMP  (LMP Unknown)   SpO2 98%   BMI 32.68 kg/m  BP Readings from Last 3 Encounters:  08/02/23 110/78  08/26/22 (!) 143/90  10/25/21 121/83   Wt Readings from Last 3 Encounters:  08/02/23 208 lb 3.2 oz (94.4 kg)  10/25/21 193 lb (87.5 kg)  07/20/18 180 lb (81.6 kg)      Physical Exam Constitutional:      General: She is not in acute distress.    Appearance: Normal appearance.  HENT:     Head: Normocephalic and atraumatic.     Right Ear: Tympanic membrane and ear canal normal.     Left Ear: Tympanic membrane and ear canal normal.     Nose: Nose normal.     Mouth/Throat:     Mouth: Mucous membranes are moist.  Eyes:     Extraocular Movements: Extraocular movements intact.     Conjunctiva/sclera: Conjunctivae normal.  Neck:      Comments: Thyroid normal in size without tenderness.  A small 1 mm round nodule to the right of midline at base of neck Cardiovascular:     Rate and Rhythm: Normal rate and regular rhythm.     Heart sounds: Normal heart sounds. No murmur heard.    No gallop.  Pulmonary:     Effort: Pulmonary effort is normal. No respiratory distress.     Breath sounds: Normal breath sounds. No wheezing, rhonchi or rales.  Musculoskeletal:        General: Normal range of motion.  Skin:    General: Skin is warm and dry.  Neurological:     Mental Status: She is alert and oriented to person, place, and time.      No results found for any visits on 08/02/23.    Assessment & Plan:  Hot flashes -Continue Veozah  -Continue follow-up with OB/GYN. -If Veozah  ineffective consider gabapentin or supplements such as black coash -     Comprehensive metabolic panel -     CBC with Differential/Platelet -     Hemoglobin A1c -     TSH -     T4, free  History of asthma -Stable -Not  currently on medication.  Continue to monitor -     CBC with Differential/Platelet  Encounter to establish care -We reviewed the PMH, PSH, FH, SH, Meds and Allergies. -We provided refills for any medications we will prescribe as needed. -We addressed current concerns per orders and patient instructions. -We have asked for records for pertinent exams, studies, vaccines and notes from previous providers. -We have advised patient to follow up per  instructions below.  Family history of cardiovascular disease -     Lipid panel  Thyroid nodule -Small mobile nodule noted on exam. -Obtain labs.  If needed ultrasound based on results. -Continue to monitor -     TSH -     T4, free  No follow-ups on file.  Follow-up as needed  Clotilda JONELLE Single, MD

## 2023-08-02 NOTE — Patient Instructions (Signed)
 It was nice meeting you today!

## 2023-08-03 ENCOUNTER — Encounter: Payer: Self-pay | Admitting: Family Medicine

## 2023-08-28 ENCOUNTER — Other Ambulatory Visit (HOSPITAL_COMMUNITY): Payer: Self-pay

## 2023-08-28 MED ORDER — PHENTERMINE HCL 37.5 MG PO TABS
37.5000 mg | ORAL_TABLET | Freq: Every day | ORAL | 0 refills | Status: DC
  Filled 2023-08-28: qty 90, 90d supply, fill #0

## 2023-08-30 ENCOUNTER — Other Ambulatory Visit: Payer: Self-pay

## 2023-08-30 ENCOUNTER — Other Ambulatory Visit (HOSPITAL_COMMUNITY): Payer: Self-pay

## 2023-12-14 ENCOUNTER — Other Ambulatory Visit (HOSPITAL_COMMUNITY): Payer: Self-pay

## 2023-12-14 MED ORDER — PHENTERMINE HCL 37.5 MG PO TABS
37.5000 mg | ORAL_TABLET | Freq: Every day | ORAL | 3 refills | Status: DC
  Filled 2023-12-14: qty 30, 30d supply, fill #0

## 2024-07-03 ENCOUNTER — Other Ambulatory Visit (HOSPITAL_COMMUNITY): Payer: Self-pay

## 2024-07-03 MED ORDER — ONDANSETRON HCL 4 MG PO TABS
8.0000 mg | ORAL_TABLET | Freq: Two times a day (BID) | ORAL | 2 refills | Status: DC
  Filled 2024-07-03: qty 90, 23d supply, fill #0

## 2024-07-03 MED ORDER — FLUCONAZOLE 150 MG PO TABS
ORAL_TABLET | ORAL | 4 refills | Status: DC
  Filled 2024-07-03: qty 2, 3d supply, fill #0

## 2024-07-03 MED ORDER — AZITHROMYCIN 250 MG PO TABS
ORAL_TABLET | ORAL | 0 refills | Status: DC
  Filled 2024-07-03: qty 6, 5d supply, fill #0

## 2024-07-21 ENCOUNTER — Other Ambulatory Visit: Payer: Self-pay | Admitting: Obstetrics and Gynecology

## 2024-07-21 ENCOUNTER — Ambulatory Visit
Admission: RE | Admit: 2024-07-21 | Discharge: 2024-07-21 | Disposition: A | Source: Ambulatory Visit | Attending: Obstetrics and Gynecology | Admitting: Obstetrics and Gynecology

## 2024-07-21 DIAGNOSIS — Z1231 Encounter for screening mammogram for malignant neoplasm of breast: Secondary | ICD-10-CM

## 2024-08-04 ENCOUNTER — Ambulatory Visit: Payer: 59 | Admitting: Family Medicine

## 2024-08-04 ENCOUNTER — Encounter: Payer: Self-pay | Admitting: Family Medicine

## 2024-08-04 VITALS — BP 112/80 | HR 54 | Temp 98.7°F | Ht 66.93 in | Wt 182.8 lb

## 2024-08-04 DIAGNOSIS — R0683 Snoring: Secondary | ICD-10-CM | POA: Diagnosis not present

## 2024-08-04 DIAGNOSIS — R5383 Other fatigue: Secondary | ICD-10-CM

## 2024-08-04 DIAGNOSIS — Z0000 Encounter for general adult medical examination without abnormal findings: Secondary | ICD-10-CM | POA: Diagnosis not present

## 2024-08-04 DIAGNOSIS — E041 Nontoxic single thyroid nodule: Secondary | ICD-10-CM

## 2024-08-04 DIAGNOSIS — G4700 Insomnia, unspecified: Secondary | ICD-10-CM | POA: Diagnosis not present

## 2024-08-04 LAB — LIPID PANEL
Cholesterol: 250 mg/dL — ABNORMAL HIGH (ref 28–200)
HDL: 60.8 mg/dL
LDL Cholesterol: 175 mg/dL — ABNORMAL HIGH (ref 10–99)
NonHDL: 188.86
Total CHOL/HDL Ratio: 4
Triglycerides: 68 mg/dL (ref 10.0–149.0)
VLDL: 13.6 mg/dL (ref 0.0–40.0)

## 2024-08-04 LAB — VITAMIN B12: Vitamin B-12: 232 pg/mL (ref 211–911)

## 2024-08-04 LAB — TSH: TSH: 0.81 u[IU]/mL (ref 0.35–5.50)

## 2024-08-04 LAB — CBC WITH DIFFERENTIAL/PLATELET
Basophils Absolute: 0 K/uL (ref 0.0–0.1)
Basophils Relative: 0.6 % (ref 0.0–3.0)
Eosinophils Absolute: 0.2 K/uL (ref 0.0–0.7)
Eosinophils Relative: 4 % (ref 0.0–5.0)
HCT: 39.7 % (ref 36.0–46.0)
Hemoglobin: 13.5 g/dL (ref 12.0–15.0)
Lymphocytes Relative: 53.4 % — ABNORMAL HIGH (ref 12.0–46.0)
Lymphs Abs: 2.3 K/uL (ref 0.7–4.0)
MCHC: 34.1 g/dL (ref 30.0–36.0)
MCV: 93.4 fl (ref 78.0–100.0)
Monocytes Absolute: 0.3 K/uL (ref 0.1–1.0)
Monocytes Relative: 7.5 % (ref 3.0–12.0)
Neutro Abs: 1.5 K/uL (ref 1.4–7.7)
Neutrophils Relative %: 34.5 % — ABNORMAL LOW (ref 43.0–77.0)
Platelets: 267 K/uL (ref 150.0–400.0)
RBC: 4.25 Mil/uL (ref 3.87–5.11)
RDW: 12.7 % (ref 11.5–15.5)
WBC: 4.4 K/uL (ref 4.0–10.5)

## 2024-08-04 LAB — COMPREHENSIVE METABOLIC PANEL WITH GFR
ALT: 14 U/L (ref 3–35)
AST: 14 U/L (ref 5–37)
Albumin: 4.2 g/dL (ref 3.5–5.2)
Alkaline Phosphatase: 46 U/L (ref 39–117)
BUN: 11 mg/dL (ref 6–23)
CO2: 25 meq/L (ref 19–32)
Calcium: 9.1 mg/dL (ref 8.4–10.5)
Chloride: 106 meq/L (ref 96–112)
Creatinine, Ser: 0.84 mg/dL (ref 0.40–1.20)
GFR: 86.04 mL/min
Glucose, Bld: 62 mg/dL — ABNORMAL LOW (ref 70–99)
Potassium: 4.5 meq/L (ref 3.5–5.1)
Sodium: 138 meq/L (ref 135–145)
Total Bilirubin: 0.6 mg/dL (ref 0.2–1.2)
Total Protein: 7.2 g/dL (ref 6.0–8.3)

## 2024-08-04 LAB — VITAMIN D 25 HYDROXY (VIT D DEFICIENCY, FRACTURES): VITD: 28.36 ng/mL — ABNORMAL LOW (ref 30.00–100.00)

## 2024-08-04 LAB — T4, FREE: Free T4: 0.96 ng/dL (ref 0.60–1.60)

## 2024-08-04 LAB — HEMOGLOBIN A1C: Hgb A1c MFr Bld: 5.1 % (ref 4.6–6.5)

## 2024-08-04 NOTE — Progress Notes (Signed)
 "  Established Patient Office Visit   Subjective  Patient ID: [name], female    DOB: [birth year]  Age: 44 y.o. MRN: [id number]  Chief Complaint  Patient presents with   Annual Exam    Patient is a 44 year old female seen for CPE.  Patient states she is doing well overall.  Having difficulty with sleep latency.  Able to fall asleep but not able to stay asleep.  Waking up several times per night.  Denies mind racing.  Wakes up feeling unrested.  Tried magnesium  and another OTC med.  Feels like he needs to take a nap around noon.  Tried Veozah  for hot flashes with OB/GYN.  Helps some.  Has been told that she snores by her husband and sister.    Patient Active Problem List   Diagnosis Date Noted   Irregular intermenstrual bleeding 10/25/2021   Overweight with body mass index (BMI) 25.0-29.9 10/25/2021   Abnormal Pap smear of cervix 10/25/2021   Breast lump 10/25/2021   Cyst of ovary 10/25/2021   Vaginal discharge 09/09/2018   Family history of breast cancer 12/06/2017   RTI (respiratory tract infection) 07/13/2016   Cough 07/13/2016   Functional dysphonia 11/25/2014   Exercise-induced bronchospasm 10/15/2014   Mild intermittent asthma without complication 10/15/2014   Mild persistent asthma without complication 10/15/2014   Asthma 08/06/2014   DOE (dyspnea on exertion) 08/06/2014   Dyspnea and respiratory abnormality 03/18/2014   Orthopnea 03/18/2014   Asthma, not well controlled 01/16/2012   Pedal edema 12/21/2011   Obesity 11/17/2011   LGSIL (low grade squamous intraepithelial dysplasia) 11/02/2011   Past Medical History:  Diagnosis Date   Abnormal Pap smear    Colposcopy    Asthma    H/O candidiasis    H/O cystitis    H/O varicella    Hemorrhoids 01/29/2012   LGSIL (low grade squamous intraepithelial dysplasia) 11/02/2011   Last pap WNL    Obesity 11/17/2011   Pedal edema 12/21/2011   Preterm labor    with 1st pregnancy   Past Surgical History:  Procedure  Laterality Date   DILATION AND CURETTAGE OF UTERUS  11/2010   DILATION AND EVACUATION  02/25/2011   Procedure: DILATATION AND EVACUATION (D&E);  Surgeon: Ovid DELENA All, MD;  Location: [facility];  Service: Gynecology;  Laterality: N/A;  with chromosome studies   Social History[1] Family History  Problem Relation Age of Onset   Breast cancer Mother    Diabetes Sister        type II   Heart disease Maternal Grandmother        MI   Breast cancer Cousin    Breast cancer Cousin    Breast cancer Cousin    Anesthesia problems Neg Hx    Malignant hyperthermia Neg Hx    Hypotension Neg Hx    Pseudochol deficiency Neg Hx    Allergies[2]  ROS Negative unless stated above    Objective:     BP 112/80 (BP Location: Left Arm, Patient Position: Sitting, Cuff Size: Large)   Pulse (!) 54   Temp 98.7 F (37.1 C) (Oral)   Ht 5' 6.93 (1.7 m)   Wt 182 lb 12.8 oz (82.9 kg)   LMP 05/04/2024   SpO2 100%   BMI 28.69 kg/m  BP Readings from Last 3 Encounters:  08/04/24 112/80  08/02/23 110/78  08/26/22 (!) 143/90   Wt Readings from Last 3 Encounters:  08/04/24 182 lb 12.8 oz (82.9 kg)  08/02/23 208  lb 3.2 oz (94.4 kg)  10/25/21 193 lb (87.5 kg)      Physical Exam Constitutional:      Appearance: Normal appearance.  HENT:     Head: Normocephalic and atraumatic.     Right Ear: Tympanic membrane, ear canal and external ear normal.     Left Ear: Tympanic membrane, ear canal and external ear normal.     Nose: Nose normal.     Mouth/Throat:     Mouth: Mucous membranes are moist.     Pharynx: No oropharyngeal exudate or posterior oropharyngeal erythema.  Eyes:     General: No scleral icterus.    Extraocular Movements: Extraocular movements intact.     Conjunctiva/sclera: Conjunctivae normal.     Pupils: Pupils are equal, round, and reactive to light.  Neck:     Thyroid: No thyromegaly.     Vascular: No carotid bruit.     Comments: Thyroid nodule R lobe 1 mm, round, stable,  nontender. Cardiovascular:     Rate and Rhythm: Normal rate and regular rhythm.     Pulses: Normal pulses.     Heart sounds: Normal heart sounds. No murmur heard.    No friction rub.  Pulmonary:     Effort: Pulmonary effort is normal.     Breath sounds: Normal breath sounds. No wheezing, rhonchi or rales.  Abdominal:     General: Bowel sounds are normal.     Palpations: Abdomen is soft.     Tenderness: There is no abdominal tenderness.  Musculoskeletal:        General: No deformity. Normal range of motion.  Lymphadenopathy:     Cervical: No cervical adenopathy.  Skin:    General: Skin is warm and dry.     Findings: No lesion.  Neurological:     General: No focal deficit present.     Mental Status: She is alert and oriented to person, place, and time.  Psychiatric:        Mood and Affect: Mood normal.        Thought Content: Thought content normal.        08/02/2023    9:27 AM  Depression screen PHQ 2/9  Decreased Interest 0  Down, Depressed, Hopeless 0  PHQ - 2 Score 0  Altered sleeping 3  Tired, decreased energy 2  Change in appetite 2  Feeling bad or failure about yourself  1  Trouble concentrating 1  Moving slowly or fidgety/restless 0  Suicidal thoughts 0  PHQ-9 Score 9      Data saved with a previous flowsheet row definition      08/02/2023    9:28 AM  GAD 7 : Generalized Anxiety Score  Nervous, Anxious, on Edge 1  Control/stop worrying 0  Worry too much - different things 1  Trouble relaxing 1  Restless 1  Easily annoyed or irritable 1  Afraid - awful might happen 0  Total GAD 7 Score 5  Anxiety Difficulty Not difficult at all     No results found for any visits on 08/04/24.    Assessment & Plan:   Well adult exam -     CBC with Differential/Platelet; Future -     Comprehensive metabolic panel with GFR; Future -     Hemoglobin A1c; Future -     Lipid panel; Future -     T4, free; Future -     TSH; Future  Insomnia, unspecified type -      T4, free; Future -  TSH; Future -     Pulmonary Visit  Snoring -     CBC with Differential/Platelet; Future -     Pulmonary Visit  Fatigue, unspecified type -     Vitamin B12; Future -     VITAMIN D  25 Hydroxy (Vit-D Deficiency, Fractures); Future  Thyroid nodule  Age-appropriate health screenings discussed.  Obtain labs.  Immunizations reviewed.  Mammogram up-to-date done 04/24/2024.  Pap with OB/GYN.  Colonoscopy not yet indicated.  Discussed sleep hygiene for insomnia and OTC sleep meds.  At home sleep study given history of snoring.  Change in sleep position, weight loss.  For continued symptoms consider medication options.  Thyroid nodule stable, nontender.  Monitor.  Return in about 1 year (around 08/04/2025), or if symptoms worsen or fail to improve, for physical.   Clotilda JONELLE Single, MD     [1]  Social History Tobacco Use   Smoking status: Never   Smokeless tobacco: Never  Substance Use Topics   Alcohol use: No   Drug use: No  [2]  Allergies Allergen Reactions   Ortho [facility]ra [Norelgestromin-Eth Estradiol ] Hives   "

## 2024-08-06 ENCOUNTER — Ambulatory Visit: Payer: Self-pay | Admitting: Family Medicine

## 2024-08-06 DIAGNOSIS — E559 Vitamin D deficiency, unspecified: Secondary | ICD-10-CM

## 2024-08-06 MED ORDER — VITAMIN D (ERGOCALCIFEROL) 1.25 MG (50000 UNIT) PO CAPS
50000.0000 [IU] | ORAL_CAPSULE | ORAL | 0 refills | Status: AC
  Filled 2024-08-06: qty 4, 28d supply, fill #0

## 2024-08-07 ENCOUNTER — Other Ambulatory Visit (HOSPITAL_COMMUNITY): Payer: Self-pay

## 2024-08-12 ENCOUNTER — Telehealth: Payer: Self-pay

## 2024-08-12 NOTE — Telephone Encounter (Signed)
 Called and spoke with patient, [facility] is try to sch patient for sleep study, patient is aware and giving 1800 number to call

## 2024-08-28 ENCOUNTER — Other Ambulatory Visit (HOSPITAL_COMMUNITY): Payer: Self-pay

## 2024-08-28 MED ORDER — ESTRADIOL 0.025 MG/24HR TD PTTW
1.0000 | MEDICATED_PATCH | TRANSDERMAL | 5 refills | Status: AC
  Filled 2024-08-28: qty 8, 28d supply, fill #0

## 2024-08-28 MED ORDER — ALPRAZOLAM 0.25 MG PO TABS
0.2500 mg | ORAL_TABLET | ORAL | 0 refills | Status: AC
  Filled 2024-08-28: qty 4, 2d supply, fill #0

## 2024-08-28 MED ORDER — OXYCODONE HCL 5 MG PO TABS
5.0000 mg | ORAL_TABLET | ORAL | 0 refills | Status: AC | PRN
  Filled 2024-08-28: qty 20, 4d supply, fill #0

## 2024-08-28 MED ORDER — IBUPROFEN 600 MG PO TABS
600.0000 mg | ORAL_TABLET | Freq: Three times a day (TID) | ORAL | 1 refills | Status: AC
  Filled 2024-08-28: qty 30, 10d supply, fill #0

## 2024-08-29 ENCOUNTER — Other Ambulatory Visit (HOSPITAL_COMMUNITY): Payer: Self-pay
